# Patient Record
Sex: Female | Born: 1965 | Race: White | Hispanic: No | Marital: Single | Smoking: Current every day smoker
Health system: Southern US, Community
[De-identification: ages and names within clinical notes are randomized; demographics above are authoritative.]

## PROBLEM LIST (undated history)

## (undated) DIAGNOSIS — K219 Gastro-esophageal reflux disease without esophagitis: Secondary | ICD-10-CM

## (undated) DIAGNOSIS — G629 Polyneuropathy, unspecified: Secondary | ICD-10-CM

## (undated) DIAGNOSIS — F988 Other specified behavioral and emotional disorders with onset usually occurring in childhood and adolescence: Secondary | ICD-10-CM

## (undated) DIAGNOSIS — M797 Fibromyalgia: Secondary | ICD-10-CM

## (undated) DIAGNOSIS — F329 Major depressive disorder, single episode, unspecified: Secondary | ICD-10-CM

## (undated) DIAGNOSIS — F209 Schizophrenia, unspecified: Secondary | ICD-10-CM

## (undated) DIAGNOSIS — M419 Scoliosis, unspecified: Secondary | ICD-10-CM

## (undated) DIAGNOSIS — F419 Anxiety disorder, unspecified: Secondary | ICD-10-CM

## (undated) DIAGNOSIS — F32A Depression, unspecified: Secondary | ICD-10-CM

## (undated) HISTORY — PX: JOINT REPLACEMENT: SHX530

## (undated) HISTORY — PX: REPLACEMENT TOTAL KNEE BILATERAL: SUR1225

## (undated) HISTORY — PX: BILATERAL CARPAL TUNNEL RELEASE: SHX6508

## (undated) HISTORY — PX: FEMUR SURGERY: SHX943

## (undated) HISTORY — PX: CHOLECYSTECTOMY: SHX55

---

## 2018-02-20 ENCOUNTER — Emergency Department
Admission: EM | Admit: 2018-02-20 | Discharge: 2018-02-20 | Discharge status: Against Medical Advice | Payer: Self-pay | Attending: Emergency Medicine | Admitting: Emergency Medicine

## 2018-02-20 ENCOUNTER — Other Ambulatory Visit: Payer: Self-pay

## 2018-02-20 ENCOUNTER — Encounter: Payer: Self-pay | Admitting: Emergency Medicine

## 2018-02-20 DIAGNOSIS — F17210 Nicotine dependence, cigarettes, uncomplicated: Secondary | ICD-10-CM | POA: Insufficient documentation

## 2018-02-20 DIAGNOSIS — F17200 Nicotine dependence, unspecified, uncomplicated: Secondary | ICD-10-CM | POA: Insufficient documentation

## 2018-02-20 DIAGNOSIS — Z5900 Homelessness unspecified: Secondary | ICD-10-CM

## 2018-02-20 DIAGNOSIS — F1910 Other psychoactive substance abuse, uncomplicated: Secondary | ICD-10-CM | POA: Insufficient documentation

## 2018-02-20 DIAGNOSIS — F329 Major depressive disorder, single episode, unspecified: Secondary | ICD-10-CM | POA: Insufficient documentation

## 2018-02-20 DIAGNOSIS — Z96653 Presence of artificial knee joint, bilateral: Secondary | ICD-10-CM | POA: Insufficient documentation

## 2018-02-20 HISTORY — DX: Scoliosis, unspecified: M41.9

## 2018-02-20 HISTORY — DX: Other specified behavioral and emotional disorders with onset usually occurring in childhood and adolescence: F98.8

## 2018-02-20 HISTORY — DX: Polyneuropathy, unspecified: G62.9

## 2018-02-20 HISTORY — DX: Major depressive disorder, single episode, unspecified: F32.9

## 2018-02-20 HISTORY — DX: Schizophrenia, unspecified: F20.9

## 2018-02-20 HISTORY — DX: Fibromyalgia: M79.7

## 2018-02-20 HISTORY — DX: Anxiety disorder, unspecified: F41.9

## 2018-02-20 HISTORY — DX: Depression, unspecified: F32.A

## 2018-02-20 HISTORY — DX: Gastro-esophageal reflux disease without esophagitis: K21.9

## 2018-02-20 LAB — URINE DRUG SCREEN, QUALITATIVE (ARMC ONLY)
Amphetamines, Ur Screen: POSITIVE — AB
Barbiturates, Ur Screen: NOT DETECTED
Benzodiazepine, Ur Scrn: POSITIVE — AB
Cannabinoid 50 Ng, Ur ~~LOC~~: POSITIVE — AB
Cocaine Metabolite,Ur ~~LOC~~: POSITIVE — AB
MDMA (Ecstasy)Ur Screen: NOT DETECTED
Methadone Scn, Ur: NOT DETECTED
Opiate, Ur Screen: NOT DETECTED
Phencyclidine (PCP) Ur S: NOT DETECTED
Tricyclic, Ur Screen: NOT DETECTED

## 2018-02-20 LAB — COMPREHENSIVE METABOLIC PANEL
ALT: 20 U/L (ref 14–54)
AST: 25 U/L (ref 15–41)
Albumin: 3.9 g/dL (ref 3.5–5.0)
Alkaline Phosphatase: 123 U/L (ref 38–126)
Anion gap: 8 (ref 5–15)
BUN: 14 mg/dL (ref 6–20)
CHLORIDE: 106 mmol/L (ref 101–111)
CO2: 24 mmol/L (ref 22–32)
CREATININE: 0.72 mg/dL (ref 0.44–1.00)
Calcium: 8.9 mg/dL (ref 8.9–10.3)
GFR calc Af Amer: >60 mL/min (ref >60)
GFR calc non Af Amer: >60 mL/min (ref >60)
Glucose, Bld: 105 mg/dL — ABNORMAL HIGH (ref 65–99)
POTASSIUM: 4 mmol/L (ref 3.5–5.1)
SODIUM: 138 mmol/L (ref 135–145)
Total Bilirubin: 0.3 mg/dL (ref 0.3–1.2)
Total Protein: 7.7 g/dL (ref 6.5–8.1)

## 2018-02-20 LAB — CBC
HCT: 39.8 % (ref 35.0–47.0)
HEMOGLOBIN: 13.7 g/dL (ref 12.0–16.0)
MCH: 31.3 pg (ref 26.0–34.0)
MCHC: 34.4 g/dL (ref 32.0–36.0)
MCV: 90.9 fL (ref 80.0–100.0)
Platelets: 272 10*3/uL (ref 150–440)
RBC: 4.38 MIL/uL (ref 3.80–5.20)
RDW: 13.6 % (ref 11.5–14.5)
WBC: 8.5 10*3/uL (ref 3.6–11.0)

## 2018-02-20 LAB — SALICYLATE LEVEL

## 2018-02-20 LAB — ETHANOL: Alcohol, Ethyl (B): <10 mg/dL

## 2018-02-20 LAB — ACETAMINOPHEN LEVEL: Acetaminophen (Tylenol), Serum: <10 ug/mL — ABNORMAL LOW (ref 10–30)

## 2018-02-20 NOTE — ED Notes (Signed)
Dr. Paduchowski at bedside.  

## 2018-02-20 NOTE — ED Notes (Signed)
Patient screaming in room at MD [facility]. Patient stated: "I just want to die. I'm sick of this shit. You aren't doing anything to help me. I'm going to leave." This RN called security to bedside.  Patient informed MD she wanted to leave AMA. MD informed the she was within her rights to leave. Patient grabbed her luggage and left room. Patient would not let this RN take vital signs, nor would she sign AMA form.

## 2018-02-20 NOTE — ED Notes (Signed)
RN to bedside to greet patient. Patient tearful, asking what plan is. RN informed she would speak with MD. MD Paduchowski informed. MD reported he would update the patient.
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
## 2018-02-20 NOTE — ED Notes (Signed)
ED Provider at bedside. 

## 2018-02-21 ENCOUNTER — Other Ambulatory Visit: Payer: Self-pay

## 2018-02-21 ENCOUNTER — Encounter: Payer: Self-pay | Admitting: Emergency Medicine

## 2018-02-21 ENCOUNTER — Emergency Department
Admission: EM | Admit: 2018-02-21 | Discharge: 2018-02-21 | Disposition: A | Discharge status: Home / Self Care | Payer: Self-pay | Attending: Emergency Medicine | Admitting: Emergency Medicine

## 2018-02-21 DIAGNOSIS — F331 Major depressive disorder, recurrent, moderate: Secondary | ICD-10-CM

## 2018-02-21 DIAGNOSIS — F1410 Cocaine abuse, uncomplicated: Secondary | ICD-10-CM

## 2018-02-21 DIAGNOSIS — Z5900 Homelessness unspecified: Secondary | ICD-10-CM

## 2018-02-21 DIAGNOSIS — F1210 Cannabis abuse, uncomplicated: Secondary | ICD-10-CM

## 2018-02-21 DIAGNOSIS — F4325 Adjustment disorder with mixed disturbance of emotions and conduct: Secondary | ICD-10-CM

## 2018-02-21 DIAGNOSIS — F1910 Other psychoactive substance abuse, uncomplicated: Secondary | ICD-10-CM

## 2018-02-21 LAB — URINE DRUG SCREEN, QUALITATIVE (ARMC ONLY)
Amphetamines, Ur Screen: POSITIVE — AB
BARBITURATES, UR SCREEN: NOT DETECTED
Benzodiazepine, Ur Scrn: POSITIVE — AB
CANNABINOID 50 NG, UR ~~LOC~~: POSITIVE — AB
COCAINE METABOLITE, UR ~~LOC~~: POSITIVE — AB
MDMA (Ecstasy)Ur Screen: NOT DETECTED
Methadone Scn, Ur: NOT DETECTED
Opiate, Ur Screen: NOT DETECTED
PHENCYCLIDINE (PCP) UR S: NOT DETECTED
TRICYCLIC, UR SCREEN: NOT DETECTED

## 2018-02-21 LAB — CBC
HCT: 39.8 % (ref 35.0–47.0)
Hemoglobin: 13.5 g/dL (ref 12.0–16.0)
MCH: 31.5 pg (ref 26.0–34.0)
MCHC: 34 g/dL (ref 32.0–36.0)
MCV: 92.7 fL (ref 80.0–100.0)
PLATELETS: 247 10*3/uL (ref 150–440)
RBC: 4.29 MIL/uL (ref 3.80–5.20)
RDW: 13.8 % (ref 11.5–14.5)
WBC: 6.3 10*3/uL (ref 3.6–11.0)

## 2018-02-21 LAB — BASIC METABOLIC PANEL
Anion gap: 6 (ref 5–15)
BUN: 18 mg/dL (ref 6–20)
CO2: 28 mmol/L (ref 22–32)
CREATININE: 0.8 mg/dL (ref 0.44–1.00)
Calcium: 8.9 mg/dL (ref 8.9–10.3)
Chloride: 103 mmol/L (ref 101–111)
GFR calc Af Amer: >60 mL/min (ref >60)
GLUCOSE: 118 mg/dL — AB (ref 65–99)
Potassium: 4.1 mmol/L (ref 3.5–5.1)
SODIUM: 137 mmol/L (ref 135–145)

## 2018-02-21 MED ORDER — GABAPENTIN 300 MG PO CAPS
300.0000 mg | ORAL_CAPSULE | Freq: Three times a day (TID) | ORAL | Status: DC
  Filled 2018-02-21 (×2): qty 1

## 2018-02-21 MED ORDER — ACETAMINOPHEN 500 MG PO TABS
1000.0000 mg | ORAL_TABLET | Freq: Once | ORAL | Status: AC
  Administered 2018-02-21: 1000 mg via ORAL
  Filled 2018-02-21: qty 2

## 2018-02-21 MED ORDER — QUETIAPINE FUMARATE 25 MG PO TABS: 100.0000 mg | ORAL_TABLET | Freq: Every day | ORAL | Status: DC

## 2018-02-21 MED ORDER — IBUPROFEN 600 MG PO TABS
600.0000 mg | ORAL_TABLET | ORAL | Status: AC
  Administered 2018-02-21: 600 mg via ORAL
  Filled 2018-02-21: qty 1

## 2018-02-21 NOTE — ED Notes (Signed)
Patient to 20-A; refusing to talk; stating, "Can't they read; I told them earlier what my meds are; I'm not going to repeat it again."

## 2018-02-21 NOTE — ED Notes (Signed)
Patient alert and oriented. Patient denies SI/HI and A/V hallucinations. Patient tearful and states she just want to go home. Patient sad states she came here from [place] and has no where or no one. Patient provided support and encouragement. Monitoring of patient continues.

## 2018-02-21 NOTE — ED Notes (Signed)
Patient speaking with S.O.C.
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
## 2018-02-21 NOTE — BH Assessment (Signed)
Per Dr. Prescott Gum patient doesn't meet criteria for inpatient psychiatric treatment.

## 2018-02-21 NOTE — ED Notes (Signed)
Patient discharge to homeless shelter. Patient provided transport via cab to shelter. Patient alert, oriented and ambulatory. Patient denies pain. All patient belongings returned to her. AVS/Discharge instructions reviewed with patient. Patient given written copy and verbalized understanding. Patient vitals at discharge 98.1-106/81-69-20-99% room air.
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
## 2018-02-21 NOTE — BH Assessment (Signed)
Pt denied to speak with TTS. TTS asked pt how she was doing pt responded, "How do I look?" Pt then proceeded to say can I have some privacy?!" TTS offered to escort pt to consult room for privacy during assessment, pt responded by saying"No, I don't want to walk. I didn't come here to walk."

## 2018-02-21 NOTE — ED Notes (Signed)
Patient offered breakfast meal and she decline.
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
## 2018-02-21 NOTE — Discharge Instructions (Addendum)

## 2018-02-21 NOTE — ED Notes (Signed)
Lunch meal given to patient.

## 2018-02-21 NOTE — ED Provider Notes (Addendum)
Patient has been resting comfortably in no distress throughout the shift.  She has eaten a meal tray.  Seen by Dr. Weber Cooks who advises:  Disposition: No evidence of imminent risk to self or others at present.   Patient does not meet criteria for psychiatric inpatient admission. Supportive therapy provided about ongoing stressors. Discussed crisis plan, support from social network, calling 911, coming to the Emergency Department, and calling Suicide Hotline.    ----------------------------------------- 1:06 PM on 02/21/2018 -----------------------------------------  Currently awaiting social work consult due to the patient's tenuous living situation and homelessness.  IVC rescinded by Dr. Weber Cooks.   Delman Kitten, MD 02/21/18 1306   ----------------------------------------- 1:46 PM on 02/21/2018 -----------------------------------------  Patient resting comfortably in no distress.  Seen by social work, patient being advised to return to the local shelter, she may not be able to get in but unfortunately does not have any immediate placement option.  She is unfortunately homeless, resources offered patient will be discharged.  Cleared by psychiatry.  She is alert, well oriented no acute distress.  Return precautions and treatment recommendations and follow-up discussed with the patient who is agreeable with the plan.     Delman Kitten, MD 02/21/18 [phone]

## 2018-02-21 NOTE — ED Notes (Signed)
Pt has in her belongings:   2 beach bags full of clothes/ 1 suitcase full of clothes/  1 duffle bag full of clothes/ 2 bras on her person/ 1 tank top on her person/ 1 pair of black pants/ 1 underwear/ 1 pair of grey socks/ 1 pair of grey & orange shoes/ 1 red hair band/ 2 pink hair scrunchies/ 1 set of grey colored dangle hoop earrings/ 1 pair of clear stoned earrings/ 1 yellow ring with a clear stone in the middle and 1 yellow ring with a large grey stone in the middle/ & 1 brown purse. In said purse contains the patients makeup and additional jewlery which she states is not worth the trouble of documenting as its "cheap and invaluable" this EDT tried to persuade pt to please allow me to document it and as requested she pulled out the two containers which has various stud and hoop earrings. Pt requested that it all stay in her purse where it was initially found.

## 2018-02-21 NOTE — BH Assessment (Signed)
Assessment Note  Monique Schaefer is a 52 y.o. female pt who returned to ED after being discharged earlier on the previous evening due to depression and recently becoming homeless. Pt abruptly left ED on previous encounter after becoming angry towards the doctor. Pt stayed in waiting room until she presented for this encounter. Pt reported to writer that she does not have a plan to kill herself, but "would be okay with not waking up. Pt agitated and verbally aggressive prior to assessment, but calmer during assessment. Pt reports she recently moved to [place] from [facility] on this previous Friday and has since been kicked out of her friends' home in which she moved to. Pt endorses AH stating, "I hear chatter sometimes." Pt endorses no active SI, HI, or VH.  Diagnosis: Depression  Past Medical History:  Past Medical History:  Diagnosis Date  . ADD (attention deficit disorder)   . Anxiety   . Depression   . Fibromyalgia   . GERD (gastroesophageal reflux disease)   . Neuropathy   . Schizophrenia ([place])   . Scoliosis     Past Surgical History:  Procedure Laterality Date  . BILATERAL CARPAL TUNNEL RELEASE Bilateral   . CHOLECYSTECTOMY    . FEMUR SURGERY Left   . JOINT REPLACEMENT    . REPLACEMENT TOTAL KNEE BILATERAL Bilateral     Family History: History reviewed. No pertinent family history.  Social History:  reports that she has been smoking.  She has never used smokeless tobacco. She reports that she drinks alcohol. She reports that she has current or past drug history. Drug: Marijuana.  Additional Social History:  Alcohol / Drug Use Pain Medications: see mar Prescriptions: see mar Over the Counter: see mar  History of alcohol / drug use?: Yes Longest period of sobriety (when/how long): unknown Negative Consequences of Use: Financial, Personal relationships Substance #1 Name of Substance 1: marijuana 1 - Age of First Use: pt unable to recall 1 - Amount (size/oz): varies 1 - Frequency:  weekly 1 - Duration: unknown 1 - Last Use / Amount: yesterday Substance #2 Name of Substance 2: cocaine 2 - Age of First Use: pt unable to recall 2 - Amount (size/oz): varies 2 - Frequency: weekly 2 - Duration: unknown 2 - Last Use / Amount: yesterday  CIWA: CIWA-Ar BP: (!) 143/99 Pulse Rate: 88 COWS:    Allergies:  Allergies  Allergen Reactions  . Paxil [Paroxetine Hcl] Swelling  . Morphine And Related Itching    Home Medications:  (Not in a hospital admission)  OB/GYN Status:  No LMP recorded. Patient is postmenopausal.  General Assessment Data Location of Assessment: [facility] ED TTS Assessment: In system Is this a Tele or Face-to-Face Assessment?: Face-to-Face Is this an Initial Assessment or a Re-assessment for this encounter?: Initial Assessment Marital status: Single Is patient pregnant?: No Pregnancy Status: No Living Arrangements: Other (Comment)(homeless) Can pt return to current living arrangement?: No Admission Status: Voluntary Is patient capable of signing voluntary admission?: Yes Referral Source: Self/Family/Friend Insurance type: None     Crisis Care Plan Living Arrangements: Other (Comment)(homeless) Legal Guardian: Other:(self) Name of Psychiatrist: Name unknown: [facility] Name of Therapist: Name unkown: [facility] [place]  Education Status Is patient currently in school?: No Is the patient employed, unemployed or receiving disability?: Unemployed  Risk to self with the past 6 months Suicidal Ideation: No-Not Currently/Within Last 6 Months Has patient been a risk to self within the past 6 months prior to admission? : No Suicidal Intent:  No Has patient had any suicidal intent within the past 6 months prior to admission? : No Is patient at risk for suicide?: No, but patient needs Medical Clearance Suicidal Plan?: No Has patient had any suicidal plan within the past 6 months prior to admission? : No Access to Means: No(None) What has been  your use of drugs/alcohol within the last 12 months?: Pt reports to using marijunana and cocaine recently Previous Attempts/Gestures: Yes How many times?: 2 Other Self Harm Risks: None indicated Triggers for Past Attempts: Family contact, Hallucinations Intentional Self Injurious Behavior: None Family Suicide History: No Recent stressful life event(s): Financial Problems, Recent negative physical changes, Turmoil (Comment) Persecutory voices/beliefs?: No Depression: Yes Depression Symptoms: Despondent, Tearfulness, Isolating, Fatigue, Feeling angry/irritable Substance abuse history and/or treatment for substance abuse?: Yes Suicide prevention information given to non-admitted patients: Yes  Risk to Others within the past 6 months Homicidal Ideation: No Does patient have any lifetime risk of violence toward others beyond the six months prior to admission? : No Thoughts of Harm to Others: No Current Homicidal Intent: No Current Homicidal Plan: No Access to Homicidal Means: No Identified Victim: None History of harm to others?: No Assessment of Violence: None Noted Violent Behavior Description: None indicated Does patient have access to weapons?: No Criminal Charges Pending?: No Does patient have a court date: No Is patient on probation?: No  Psychosis Hallucinations: Auditory Delusions: None noted  Mental Status Report Appearance/Hygiene: Disheveled, In scrubs Eye Contact: Fair Motor Activity: Agitation, Shuffling Speech: Pressured, Soft Level of Consciousness: Irritable Mood: Depressed, Anxious, Helpless Affect: Anxious, Depressed, Irritable Anxiety Level: Moderate Thought Processes: Relevant Judgement: Partial Orientation: Person, Place, Time, Situation, Appropriate for developmental age Obsessive Compulsive Thoughts/Behaviors: None  Cognitive Functioning Concentration: Decreased Memory: Remote Intact, Recent Impaired Is patient IDD: No Is patient DD?: No Insight:  Fair Impulse Control: Fair Appetite: Good Have you had any weight changes? : No Change Sleep: Decreased Total Hours of Sleep: 4 Vegetative Symptoms: None  ADLScreening [facility]) Patient's cognitive ability adequate to safely complete daily activities?: Yes Patient able to express need for assistance with ADLs?: Yes Independently performs ADLs?: Yes (appropriate for developmental age)  Prior Inpatient Therapy Prior Inpatient Therapy: No(Pt denies)  Prior Outpatient Therapy Prior Outpatient Therapy: Yes Prior Therapy Dates: 2012-2019 Prior Therapy Facilty/Provider(s): agency in [place], pt could not recall Reason for Treatment: depression, bipolar Does patient have an ACCT team?: No Does patient have Intensive In-House Services?  : No Does patient have [facility] services? : No Does patient have P4CC services?: No  ADL Screening (condition at time of admission) Patient's cognitive ability adequate to safely complete daily activities?: Yes Is the patient deaf or have difficulty hearing?: No Does the patient have difficulty seeing, even when wearing glasses/contacts?: No Does the patient have difficulty concentrating, remembering, or making decisions?: No Patient able to express need for assistance with ADLs?: Yes Does the patient have difficulty dressing or bathing?: No Independently performs ADLs?: Yes (appropriate for developmental age) Does the patient have difficulty walking or climbing stairs?: No Weakness of Legs: None Weakness of Arms/Hands: None  Home Assistive Devices/Equipment Home Assistive Devices/Equipment: None  Therapy Consults (therapy consults require a physician order) PT Evaluation Needed: No OT Evalulation Needed: No SLP Evaluation Needed: No Abuse/Neglect Assessment (Assessment to be complete while patient is alone) Abuse/Neglect Assessment Can Be Completed: Yes Physical Abuse: Denies Verbal Abuse: Denies Sexual Abuse: Denies Exploitation of  patient/patient's resources: Denies Self-Neglect: Denies Values / Beliefs Cultural Requests During Hospitalization:  None Spiritual Requests During Hospitalization: None Consults Spiritual Care Consult Needed: No Social Work Consult Needed: No      Additional Information 1:1 In Past 12 Months?: No CIRT Risk: No Elopement Risk: No Does patient have medical clearance?: Yes     Disposition:  Disposition Initial Assessment Completed for this Encounter: Yes Disposition of Patient: (Pending SOC) Patient refused recommended treatment: No Mode of transportation if patient is discharged?: Walking Patient referred to: (Pending SOC)  On Site Evaluation by:   Reviewed with Physician:    Ethelene Browns, [place], [facility] 02/21/2018 4:50 AM

## 2018-02-21 NOTE — ED Triage Notes (Signed)
Pt was brought to the ED by police earlier on Saturday evening as she was homeless and feeling depressed; pt moved here from [place] Friday and was kicked out of the hotel by her friend with whom she was staying; pt says friend took her ID and medication; pt was in a treatment room in this ED Saturday evening and got angry with the doctor; took her belongings and left; pt has been in the [facility] since she walked out of the treatment room, with nowhere to go (shelter would not take her as she has no ID); pt has checked back in feeling depressed; says she doesn't want to live anymore but doesn't think she'd "have the guts to actually do it"; pt tearful; c/o back and leg pain;

## 2018-02-21 NOTE — ED Notes (Signed)
Pt said "I'm not going to kill myself. I don't want to kill myself." then adds she would however like to fall asleep and not wake up

## 2018-02-21 NOTE — ED Notes (Signed)
Meal tray given to pt.

## 2018-02-21 NOTE — ED Notes (Signed)
Urine and blood collected from patient.

## 2018-03-02 ENCOUNTER — Emergency Department
Admission: EM | Admit: 2018-03-02 | Discharge: 2018-03-02 | Disposition: A | Discharge status: Home / Self Care | Payer: Self-pay | Attending: Emergency Medicine | Admitting: Emergency Medicine

## 2018-03-02 ENCOUNTER — Encounter: Payer: Self-pay | Admitting: Emergency Medicine

## 2018-03-02 DIAGNOSIS — F17210 Nicotine dependence, cigarettes, uncomplicated: Secondary | ICD-10-CM | POA: Insufficient documentation

## 2018-03-02 DIAGNOSIS — G8929 Other chronic pain: Secondary | ICD-10-CM | POA: Insufficient documentation

## 2018-03-02 DIAGNOSIS — M545 Low back pain: Secondary | ICD-10-CM | POA: Insufficient documentation

## 2018-03-02 DIAGNOSIS — R300 Dysuria: Secondary | ICD-10-CM | POA: Insufficient documentation

## 2018-03-02 LAB — URINALYSIS, COMPLETE (UACMP) WITH MICROSCOPIC
BACTERIA UA: NONE SEEN
Bilirubin Urine: NEGATIVE
Glucose, UA: NEGATIVE mg/dL
HGB URINE DIPSTICK: NEGATIVE
Ketones, ur: NEGATIVE mg/dL
Leukocytes, UA: NEGATIVE
Nitrite: NEGATIVE
PROTEIN: NEGATIVE mg/dL
Specific Gravity, Urine: 1.013 (ref 1.005–1.030)
pH: 5 (ref 5.0–8.0)

## 2018-03-02 MED ORDER — PHENAZOPYRIDINE HCL 100 MG PO TABS
95.0000 mg | ORAL_TABLET | Freq: Once | ORAL | Status: AC
  Administered 2018-03-02: 100 mg via ORAL
  Filled 2018-03-02: qty 1

## 2018-03-02 MED ORDER — BACLOFEN 10 MG PO TABS: 10.0000 mg | ORAL_TABLET | Freq: Three times a day (TID) | ORAL | 0 refills | Status: DC

## 2018-03-02 MED ORDER — PHENAZOPYRIDINE HCL 200 MG PO TABS: 200.0000 mg | ORAL_TABLET | Freq: Three times a day (TID) | ORAL | 0 refills | Status: DC | PRN

## 2018-03-02 MED ORDER — BACLOFEN 10 MG PO TABS
20.0000 mg | ORAL_TABLET | Freq: Three times a day (TID) | ORAL | Status: DC
  Administered 2018-03-02: 20 mg via ORAL
  Filled 2018-03-02 (×3): qty 2

## 2018-03-02 NOTE — ED Triage Notes (Signed)
Pt reports has chronic back pain but here lately the pain has been higher and she is having urinary urgency, frequency and dysuria. Pt denies other sx's. Thinks she has a UTI.

## 2018-03-02 NOTE — ED Notes (Signed)
See triage note  Presents with 4 day hx of dysuria and back pain

## 2018-03-02 NOTE — ED Provider Notes (Signed)
[facility] Emergency Department Provider Note  ____________________________________________  Time seen: Approximately 10:19 AM  I have reviewed the triage vital signs and the nursing notes.   HISTORY  Chief Complaint Back Pain; Urinary Frequency; and Dysuria    HPI Monique Schaefer is a 52 y.o. female who presents to the emergency department for treatment and evaluation of back pain with urinary urgency, frequency, and dysuria. No known fever. She has chronic back pain that has been worse for the past 4-5 days. Pain is across lumbar and flank bilaterally and radiates into the groin. She reports history of kidney stones and this is similar. She has taken ibuprofen and 2 Tramadol without relief and is now out of them. She states that she has only been here "2 weeks" and has not established a PCP, but does have information for the [facility].  Past Medical History:  Diagnosis Date  . ADD (attention deficit disorder)   . Anxiety   . Depression   . Fibromyalgia   . GERD (gastroesophageal reflux disease)   . Neuropathy   . Schizophrenia ([place])   . Scoliosis     Patient Active Problem List   Diagnosis Date Noted  . 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 Cocaine abuse ([place]) 02/21/2018  . Cannabis abuse 02/21/2018  . Homelessness 02/21/2018    Past Surgical History:  Procedure Laterality Date  . BILATERAL CARPAL TUNNEL RELEASE Bilateral   . CHOLECYSTECTOMY    . FEMUR SURGERY Left   . JOINT REPLACEMENT    . REPLACEMENT TOTAL KNEE BILATERAL Bilateral     Prior to Admission medications   Medication Sig Start Date End Date Taking? Authorizing Provider  baclofen (LIORESAL) 10 MG tablet Take 1 tablet (10 mg total) by mouth 3 (three) times daily. 03/02/18   Ila Landowski, Johnette Abraham B, FNP  phenazopyridine (PYRIDIUM) 200 MG tablet Take 1 tablet (200 mg total) by mouth 3 (three) times daily as needed for pain. 03/02/18    Chandi Nicklin, Johnette Abraham B, FNP    Allergies Paxil [paroxetine hcl] and Morphine and related  No family history on file.  Social History Social History   Tobacco Use  . Smoking status: Current Every Day Smoker  . Smokeless tobacco: Never Used  Substance Use Topics  . Alcohol use: Yes  . Drug use: Yes    Types: Marijuana    Review of Systems Constitutional: Negative for fever. Respiratory: Negative for shortness of breath or cough. Gastrointestinal: Positive for suprapubic pressure; negative for nausea , negative for vomiting. Negative for diarrhea or constipation. Genitourinary: Positive for dysuria , negative for vaginal discharge. Negative for STD exposure. Musculoskeletal: Chronic back pain. Skin: Intact. ____________________________________________   PHYSICAL EXAM:  VITAL SIGNS: ED Triage Vitals  Enc Vitals Group     BP 03/02/18 0950 130/88     Pulse Rate 03/02/18 0950 72     Resp 03/02/18 0950 20     Temp 03/02/18 0950 98.1 F (36.7 C)     Temp Source 03/02/18 0950 Oral     SpO2 03/02/18 0950 100 %     Weight 03/02/18 0950 210 lb (95.3 kg)     Height 03/02/18 0950 5\' 7"  (1.702 m)     Head Circumference --      Peak Flow --      Pain Score 03/02/18 1001 6     Pain Loc --      Pain Edu? --      Excl.  in [place]? --     Constitutional: Alert and oriented. Well appearing and in no acute distress. Eyes: Conjunctivae are normal. PERRL. EOMI. Head: Atraumatic. Nose: No congestion/rhinnorhea. Mouth/Throat: Mucous membranes are moist. Respiratory: Normal respiratory effort.  No retractions. Gastrointestinal:  Genitourinary: Pelvic exam: not indicated. Musculoskeletal: No extremity tenderness nor edema.  Neurologic:  Normal speech and language. No gross focal neurologic deficits are appreciated. Speech is normal. No gait instability. Skin:  Skin is warm, dry and intact. No rash noted. Psychiatric: Mood and affect are normal. Speech and behavior are  normal.  ____________________________________________   LABS (all labs ordered are listed, but only abnormal results are displayed)  Labs Reviewed  URINALYSIS, COMPLETE (UACMP) WITH MICROSCOPIC - Abnormal; Notable for the following components:      Result Value   Color, Urine YELLOW (*)    APPearance CLEAR (*)    All other components within normal limits  URINE CULTURE   ____________________________________________  RADIOLOGY  Not indicated. ____________________________________________   PROCEDURES  Procedure(s) performed: None   ____________________________________________  Labs from previous 2 visits reviewed.  52 year old female presenting to the ER for dysuria and chronic back pain. She reports symptoms are similar to previous kidney stones, but her urine is negative for hgb or RBC and pain is bilateral lumbar/flank area. She is able to relax on the bed. She will be treated symptomatically with pyridium for her dysuria and a urine culture was requested and baclofen will be prescribed for her back pain. She is to follow up with [facility] as previously advised at discharge from her visit here on 02/21/18.   INITIAL IMPRESSION / ASSESSMENT AND PLAN / ED COURSE  Pertinent labs & imaging results that were available during my care of the patient were reviewed by me and considered in my medical decision making (see chart for details).  ____________________________________________   FINAL CLINICAL IMPRESSION(S) / ED DIAGNOSES  Final diagnoses:  Dysuria  Chronic bilateral low back pain without sciatica    Note:  This document was prepared using Dragon voice recognition software and may include unintentional dictation errors.    Victorino Dike, FNP 03/02/18 1115    Lavonia Drafts, MD 03/02/18 1134

## 2018-03-03 LAB — URINE CULTURE: CULTURE: NO GROWTH

## 2018-03-04 ENCOUNTER — Ambulatory Visit: Payer: Self-pay | Admitting: Licensed Clinical Social Worker

## 2018-03-04 ENCOUNTER — Ambulatory Visit: Payer: Self-pay | Admitting: Adult Health Nurse Practitioner

## 2018-03-04 DIAGNOSIS — F4325 Adjustment disorder with mixed disturbance of emotions and conduct: Secondary | ICD-10-CM

## 2018-03-04 DIAGNOSIS — M5442 Lumbago with sciatica, left side: Principal | ICD-10-CM

## 2018-03-04 DIAGNOSIS — G8929 Other chronic pain: Secondary | ICD-10-CM

## 2018-03-04 DIAGNOSIS — M5441 Lumbago with sciatica, right side: Principal | ICD-10-CM

## 2018-03-04 DIAGNOSIS — Z0000 Encounter for general adult medical examination without abnormal findings: Secondary | ICD-10-CM | POA: Insufficient documentation

## 2018-03-04 DIAGNOSIS — M549 Dorsalgia, unspecified: Secondary | ICD-10-CM | POA: Insufficient documentation

## 2018-03-04 MED ORDER — GABAPENTIN 300 MG PO CAPS: 300.0000 mg | ORAL_CAPSULE | Freq: Three times a day (TID) | ORAL | 1 refills | Status: DC

## 2018-03-04 NOTE — Progress Notes (Signed)
Clinician met with the patient in clinic after feedback from [place] during triage that the patient needed to speak with the social worker. She administered the PHQ 9 and discussed life circumstances with the patient. Her score was a 16.  Ms. Hudock explained that she was diagnosed with Bipolar disorder, ADHD, and a has history of insomnia in the last few years. She notes that she moved her to stay with a friend from [place] but her friend is on drugs and she doesn't want to be around her. She reports that she slept on the streets for three nights until she was able to find a place to go.  She notes that she is currently staying at [employer] [employer] [facility]. She explains that she has been applying for jobs and had an interview. She reports that she has been suffering from chronic back pain. She notes that she has scoliosis in her back. She reports that she had a her gallbladder removed and two knee replacements.   Ms. Klemz explained that she wants to be able to stay on her mental health medications because they help with her symptoms. She is prescribed Seroquel 100 mg at bedtime, Adderall 20 mg twice daily, and Klonopin 1 mg twice daily for insomnia.   Clinician provided the client with a business card to see her in clinic and a handout for [place].

## 2018-03-04 NOTE — Progress Notes (Signed)
  Patient: Monique Schaefer Female    DOB: [birth year]   52 y.o.   MRN: [id number] Visit Date: 03/04/2018  Today's Provider: Staci Acosta, NP   Chief Complaint  Patient presents with  . Back Pain    new pt eval   Subjective:    HPI   Recently seen in the ED for back pain and dysuria- UA negative.  Treated with Pyridium.   Was given baclofen for the back pain.  Has hx of scoliosis.  Patient states that the pain radiates down her legs- she does endorse that gabapentin help the pain.  She is from [place]- she is looking for a job; currently at the homeless shelter.        Allergies  Allergen Reactions  . Paxil [Paroxetine Hcl] Swelling  . Morphine And Related Itching  . Oxycodone-Acetaminophen Itching   Previous Medications   AMPHETAMINE-DEXTROAMPHETAMINE (ADDERALL) 20 MG TABLET       BACLOFEN (LIORESAL) 10 MG TABLET    Take 1 tablet (10 mg total) by mouth 3 (three) times daily.   CLONAZEPAM (KLONOPIN) 1 MG TABLET    Take by mouth.   GABAPENTIN (NEURONTIN) 300 MG CAPSULE    Take by mouth.   IBUPROFEN (ADVIL,MOTRIN) 200 MG TABLET    Take by mouth.   RANITIDINE (ZANTAC) 150 MG TABLET    Take by mouth.    Review of Systems  All other systems reviewed and are negative.   Social History   Tobacco Use  . Smoking status: Current Every Day Smoker  . Smokeless tobacco: Never Used  Substance Use Topics  . Alcohol use: Yes   Objective:   BP 124/89   Temp 98.1 F (36.7 C)   Ht 5' 7.5" (1.715 m)   Wt 206 lb 14.4 oz (93.8 kg)   BMI 31.93 kg/m   Physical Exam  Constitutional: She appears well-developed and well-nourished.  HENT:  Head: Normocephalic and atraumatic.  Eyes: Pupils are equal, round, and reactive to light. EOM are normal.  Neck: Normal range of motion.  Cardiovascular: Normal rate, regular rhythm and normal heart sounds.  Pulmonary/Chest: Effort normal and breath sounds normal.  Abdominal: Soft. Bowel sounds are normal.  Musculoskeletal:       Lumbar  back: She exhibits decreased range of motion, tenderness and pain.       Back:  Skin: Skin is warm and dry.  Psychiatric:  tearful        Assessment & Plan:         Referral to Dr. Vickki Hearing for back pain.  Gabapentin refill.   Labs from hospital reviewed.  Lipid, a1c and TSH ordered.   FU in 2 months.   FU with [facility] as directed.   Staci Acosta, NP   [facility] [place]

## 2018-03-10 ENCOUNTER — Other Ambulatory Visit: Payer: Self-pay

## 2018-03-17 ENCOUNTER — Institutional Professional Consult (permissible substitution): Payer: Self-pay | Admitting: Specialist

## 2018-03-24 ENCOUNTER — Other Ambulatory Visit: Payer: Self-pay

## 2018-03-25 LAB — LIPID PANEL
Chol/HDL Ratio: 2.1 ratio (ref 0.0–4.4)
Cholesterol, Total: 149 mg/dL (ref 100–199)
HDL: 71 mg/dL (ref >39)
LDL Calculated: 65 mg/dL (ref 0–99)
TRIGLYCERIDES: 67 mg/dL (ref 0–149)
VLDL CHOLESTEROL CAL: 13 mg/dL (ref 5–40)

## 2018-03-25 LAB — HEMOGLOBIN A1C
ESTIMATED AVERAGE GLUCOSE: 105 mg/dL
HEMOGLOBIN A1C: 5.3 % (ref 4.8–5.6)

## 2018-03-25 LAB — TSH: TSH: 5.1 u[IU]/mL — ABNORMAL HIGH (ref 0.450–4.500)

## 2018-03-31 ENCOUNTER — Ambulatory Visit: Payer: Self-pay | Admitting: Specialist

## 2018-03-31 ENCOUNTER — Telehealth: Payer: Self-pay | Admitting: Licensed Clinical Social Worker

## 2018-03-31 DIAGNOSIS — M545 Low back pain: Secondary | ICD-10-CM

## 2018-03-31 NOTE — Telephone Encounter (Signed)
Clinician reached out to the patient on her request of wanting to see the psychiatrist.  Clinician left a detailed message with contact information. She explained that she has seen her as a warm hand off in clinic but not for a full on evaluation and she will need to schedule an appointment to see her first. She explains that she then has to review her case with the psychiatrist and he will determine if he will see her or not.

## 2018-03-31 NOTE — Progress Notes (Signed)
   Subjective:    Patient ID: Monique Schaefer, female    DOB: Sep 19, 1966, 52 y.o.   MRN: [id number]  HPI Long standing hx of LBP. She has had a MRI in [place] and was told she had degeneration. She is a current everyday smoker and had the patches and is starting today. 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 She has done PT in the past. She also has periprosthetic fx on left. She has had bilateral TKAs. The left was in 2014 and the rt in 2016.    Review of Systems     Objective:   Physical Exam  She has a halting gait but with no antalgic component. She is able to stand on her toes but has increased rt knee and hip pain. She is able to walk on her heels. To light touch there is no increased pain. She is tender over the rt greater trochanter. She is able to march in place with normal muscle recruitment and relaxation. She is able to stand on each leg independently with a neg trendelenburg sign. Seated her SLR is 90 degrees. ROM 20 degrees of rt thoracolumbar flexion which causes rt lateral back pain. She has 25 degrees to the left of mild left sided back pain. She has full forward flexion and 10 degrees of extension.  On inspection of her knees she has well healed scars.  MMT foot and ankle 5/5. Knee flexion/extension 2/5. Hip 5/5.  DTR's 0 at the knees 1+ at the ankles. Toe signs are downward.  Assessment & Plan:  IMP: When I told pt we had very little to offer she became upset and asked why she was even here. She wishes a doctor to tell her how to deal with her pain and she can't " take it". She has told us several times she does not want to lose her job. She has applied for SSDI and has been denied twice and is now under appeal. She does want a doctors visit today for hives that she has been having for over a week now.  Plan: We asked her to obtain her MRI results from [place] back in Dec of 2018. RTC PRN.

## 2018-04-02 ENCOUNTER — Other Ambulatory Visit: Payer: Self-pay

## 2018-04-02 ENCOUNTER — Emergency Department
Admission: EM | Admit: 2018-04-02 | Discharge: 2018-04-02 | Disposition: A | Discharge status: Home / Self Care | Payer: Self-pay | Attending: Emergency Medicine | Admitting: Emergency Medicine

## 2018-04-02 ENCOUNTER — Encounter: Payer: Self-pay | Admitting: Emergency Medicine

## 2018-04-02 DIAGNOSIS — F419 Anxiety disorder, unspecified: Secondary | ICD-10-CM | POA: Insufficient documentation

## 2018-04-02 DIAGNOSIS — F1410 Cocaine abuse, uncomplicated: Secondary | ICD-10-CM | POA: Insufficient documentation

## 2018-04-02 DIAGNOSIS — R21 Rash and other nonspecific skin eruption: Secondary | ICD-10-CM | POA: Insufficient documentation

## 2018-04-02 DIAGNOSIS — S20369A Insect bite (nonvenomous) of unspecified front wall of thorax, initial encounter: Secondary | ICD-10-CM | POA: Insufficient documentation

## 2018-04-02 DIAGNOSIS — Z79899 Other long term (current) drug therapy: Secondary | ICD-10-CM | POA: Insufficient documentation

## 2018-04-02 DIAGNOSIS — Y998 Other external cause status: Secondary | ICD-10-CM | POA: Insufficient documentation

## 2018-04-02 DIAGNOSIS — F17200 Nicotine dependence, unspecified, uncomplicated: Secondary | ICD-10-CM | POA: Insufficient documentation

## 2018-04-02 DIAGNOSIS — Z96653 Presence of artificial knee joint, bilateral: Secondary | ICD-10-CM | POA: Insufficient documentation

## 2018-04-02 DIAGNOSIS — F209 Schizophrenia, unspecified: Secondary | ICD-10-CM | POA: Insufficient documentation

## 2018-04-02 DIAGNOSIS — S80862A Insect bite (nonvenomous), left lower leg, initial encounter: Secondary | ICD-10-CM | POA: Insufficient documentation

## 2018-04-02 DIAGNOSIS — Y9389 Activity, other specified: Secondary | ICD-10-CM | POA: Insufficient documentation

## 2018-04-02 DIAGNOSIS — F1210 Cannabis abuse, uncomplicated: Secondary | ICD-10-CM | POA: Insufficient documentation

## 2018-04-02 DIAGNOSIS — F909 Attention-deficit hyperactivity disorder, unspecified type: Secondary | ICD-10-CM | POA: Insufficient documentation

## 2018-04-02 DIAGNOSIS — Z590 Homelessness: Secondary | ICD-10-CM | POA: Insufficient documentation

## 2018-04-02 DIAGNOSIS — Y9289 Other specified places as the place of occurrence of the external cause: Secondary | ICD-10-CM | POA: Insufficient documentation

## 2018-04-02 DIAGNOSIS — F329 Major depressive disorder, single episode, unspecified: Secondary | ICD-10-CM | POA: Insufficient documentation

## 2018-04-02 DIAGNOSIS — W57XXXA Bitten or stung by nonvenomous insect and other nonvenomous arthropods, initial encounter: Secondary | ICD-10-CM | POA: Insufficient documentation

## 2018-04-02 DIAGNOSIS — Z9049 Acquired absence of other specified parts of digestive tract: Secondary | ICD-10-CM | POA: Insufficient documentation

## 2018-04-02 DIAGNOSIS — S80861A Insect bite (nonvenomous), right lower leg, initial encounter: Secondary | ICD-10-CM | POA: Insufficient documentation

## 2018-04-02 DIAGNOSIS — S20469A Insect bite (nonvenomous) of unspecified back wall of thorax, initial encounter: Secondary | ICD-10-CM | POA: Insufficient documentation

## 2018-04-02 MED ORDER — RANITIDINE HCL 150 MG PO TABS: 150.0000 mg | ORAL_TABLET | Freq: Two times a day (BID) | ORAL | 0 refills | Status: DC

## 2018-04-02 MED ORDER — DEXAMETHASONE SODIUM PHOSPHATE 10 MG/ML IJ SOLN
10.0000 mg | Freq: Once | INTRAMUSCULAR | Status: AC
  Administered 2018-04-02: 10 mg via INTRAMUSCULAR
  Filled 2018-04-02: qty 1

## 2018-04-02 MED ORDER — CYPROHEPTADINE HCL 4 MG PO TABS
4.0000 mg | ORAL_TABLET | Freq: Once | ORAL | Status: AC
  Administered 2018-04-02: 4 mg via ORAL
  Filled 2018-04-02: qty 1

## 2018-04-02 MED ORDER — CYPROHEPTADINE HCL 4 MG PO TABS: 4.0000 mg | ORAL_TABLET | Freq: Three times a day (TID) | ORAL | 0 refills | Status: DC | PRN

## 2018-04-02 MED ORDER — FAMOTIDINE 20 MG PO TABS
40.0000 mg | ORAL_TABLET | Freq: Once | ORAL | Status: AC
  Administered 2018-04-02: 40 mg via ORAL
  Filled 2018-04-02: qty 2

## 2018-04-02 NOTE — ED Triage Notes (Signed)
Patient complaining of rash and itching on face and between breasts.  Started 2 days ago.  Red flat areas noted near mouth.

## 2018-04-02 NOTE — Discharge Instructions (Addendum)
Your rash appears to be caused by insect bites. Take the prescription meds as directed. Follow-up with [facility] as needed.

## 2018-04-02 NOTE — ED Provider Notes (Signed)
[facility] Emergency Department Provider Note ____________________________________________  Time seen: 1125  I have reviewed the triage vital signs and the nursing notes.  HISTORY  Chief Complaint  Rash  HPI Monique Schaefer is a 52 y.o. female presents herself to the ED for evaluation of an itchy rash patient is noted to her legs and chest.  She reports she was in a homeless shelter for at least 2 nights prior to the onset of the rash.  She is unclear of any known exposures or contacts.  She admits to scratching to the point of excoriating and causing scabs to her legs and upper back.  She denies any fevers, chills, or sweats.  She has been taking Benadryl and using over-the-counter topical creams without significant benefit.  Past Medical History:  Diagnosis Date  . ADD (attention deficit disorder)   . Anxiety   . Depression   . Fibromyalgia   . GERD (gastroesophageal reflux disease)   . Neuropathy   . Schizophrenia ([place])   . Scoliosis     Patient Active Problem List   Diagnosis Date Noted  . Back pain 03/04/2018  . Health care maintenance 03/04/2018  . 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 Cocaine abuse ([place]) 02/21/2018  . Cannabis abuse 02/21/2018  . Homelessness 02/21/2018    Past Surgical History:  Procedure Laterality Date  . BILATERAL CARPAL TUNNEL RELEASE Bilateral   . CHOLECYSTECTOMY    . FEMUR SURGERY Left   . JOINT REPLACEMENT    . REPLACEMENT TOTAL KNEE BILATERAL Bilateral     Prior to Admission medications   Medication Sig Start Date End Date Taking? Authorizing Provider  amphetamine-dextroamphetamine (ADDERALL) 20 MG tablet  02/03/18   [provider]  baclofen (LIORESAL) 10 MG tablet Take 1 tablet (10 mg total) by mouth 3 (three) times daily. 03/02/18   Triplett, Johnette Abraham B, FNP  clonazePAM (KLONOPIN) 1 MG tablet Take by mouth.    [provider]  cyproheptadine (PERIACTIN) 4  MG tablet Take 1 tablet (4 mg total) by mouth 3 (three) times daily as needed for up to 7 days for allergies. 04/02/18 04/09/18  Lynden Flemmer, Dannielle Karvonen, PA-C  gabapentin (NEURONTIN) 300 MG capsule Take 1 capsule (300 mg total) by mouth 3 (three) times daily. 03/04/18   Doles-Johnson, Teah, NP  ibuprofen (ADVIL,MOTRIN) 200 MG tablet Take by mouth.    [provider]  ranitidine (ZANTAC) 150 MG tablet Take by mouth. 11/30/17   [provider]  ranitidine (ZANTAC) 150 MG tablet Take 1 tablet (150 mg total) by mouth 2 (two) times daily. 04/02/18 05/02/18  Trixy Loyola, Dannielle Karvonen, PA-C    Allergies Paxil [paroxetine hcl]; Morphine and related; and Oxycodone-acetaminophen  No family history on file.  Social History Social History   Tobacco Use  . Smoking status: Current Every Day Smoker  . Smokeless tobacco: Never Used  Substance Use Topics  . Alcohol use: Yes  . Drug use: Not Currently    Types: Marijuana    Review of Systems  Constitutional: Negative for fever. Eyes: Negative for visual changes. ENT: Negative for sore throat. Cardiovascular: Negative for chest pain. Respiratory: Negative for shortness of breath. Musculoskeletal: Negative for back pain. Skin: Positive for rash. Neurological: Negative for headaches, focal weakness or numbness. ____________________________________________  PHYSICAL EXAM:  VITAL SIGNS: ED Triage Vitals  Enc Vitals Group     BP 04/02/18 1025 (!) 124/104     Pulse Rate 04/02/18 1025 (!)  101     Resp 04/02/18 1025 16     Temp 04/02/18 1025 98.6 F (37 C)     Temp Source 04/02/18 1025 Oral     SpO2 04/02/18 1025 98 %     Weight 04/02/18 1027 200 lb (90.7 kg)     Height 04/02/18 1027 5' 7.5" (1.715 m)     Head Circumference --      Peak Flow --      Pain Score 04/02/18 1219 0     Pain Loc --      Pain Edu? --      Excl. in [place]? --     Constitutional: Alert and oriented. Well appearing and in no distress. Head: Normocephalic and  atraumatic. Eyes: Conjunctivae are normal. Normal extraocular movements Cardiovascular: Normal rate, regular rhythm. Normal distal pulses. Respiratory: Normal respiratory effort. No wheezes/rales/rhonchi. Gastrointestinal: Soft and nontender. No distention. Musculoskeletal: Nontender with normal range of motion in all extremities.  Neurologic:  Normal gait without ataxia. Normal speech and language. No gross focal neurologic deficits are appreciated. Skin:  Skin is warm, dry and intact.  Patient with multiple scattered excoriated papules over the lower extremities, chest, and upper back.  No blister formation, pustules, vesicles, or desquamation appreciated. Psychiatric: Mood and affect are normal. Patient exhibits appropriate insight and judgment. ____________________________________________  PROCEDURES  Procedures Cyproheptadine 4 mg PO Famotidine 40 mg PO Decadron 10 mg PO ____________________________________________  INITIAL IMPRESSION / ASSESSMENT AND PLAN / ED COURSE  ED evaluation of a nonspecific rash that may represent an insect (bedbug) exposure.  Patient reports improvement of her symptoms after ED medication ministration.  She will be discharged with prescriptions for cyproheptadine ranitidine to dose for histamine blockade.  Patient will follow-up with local community clinic for ongoing symptoms. ____________________________________________  FINAL CLINICAL IMPRESSION(S) / ED DIAGNOSES  Final diagnoses:  Rash and nonspecific skin eruption  Insect bite, unspecified site, initial encounter      Melvenia Needles, PA-C 04/02/18 1851    Arta Silence, MD 04/03/18 1103

## 2018-04-02 NOTE — ED Notes (Signed)
See triage note  Presents with rash  States she recently moved out of homeless shelter  But developed a rash to bilateral lower legs ,arms and chest  Pos itching   And some areas are painful

## 2018-04-06 ENCOUNTER — Ambulatory Visit: Payer: Self-pay

## 2018-04-07 ENCOUNTER — Ambulatory Visit: Payer: Self-pay | Admitting: Pharmacy Technician

## 2018-04-07 DIAGNOSIS — Z79899 Other long term (current) drug therapy: Secondary | ICD-10-CM

## 2018-04-07 NOTE — Progress Notes (Signed)
  Completed [facility] application and contract.  Patient agreed to all terms of the [facility] contract.    Patient approved to receive medication assistance at [facility] through 2019, as long as eligibility criteria continues to be met.    Provided patient with community resource material based on her particular needs.    Provided patient with phone numbers for [facility] and [facility].  Provided patient with information about [facility].  [facility]

## 2018-04-19 ENCOUNTER — Emergency Department: Payer: Self-pay

## 2018-04-19 ENCOUNTER — Emergency Department
Admission: EM | Admit: 2018-04-19 | Discharge: 2018-04-19 | Disposition: A | Discharge status: Home / Self Care | Payer: Self-pay | Attending: Student in an Organized Health Care Education/Training Program | Admitting: Student in an Organized Health Care Education/Training Program

## 2018-04-19 ENCOUNTER — Encounter: Payer: Self-pay | Admitting: Intensive Care

## 2018-04-19 ENCOUNTER — Other Ambulatory Visit: Payer: Self-pay

## 2018-04-19 DIAGNOSIS — X58XXXA Exposure to other specified factors, initial encounter: Secondary | ICD-10-CM | POA: Insufficient documentation

## 2018-04-19 DIAGNOSIS — Z79899 Other long term (current) drug therapy: Secondary | ICD-10-CM | POA: Insufficient documentation

## 2018-04-19 DIAGNOSIS — S39012A Strain of muscle, fascia and tendon of lower back, initial encounter: Secondary | ICD-10-CM | POA: Insufficient documentation

## 2018-04-19 DIAGNOSIS — Y929 Unspecified place or not applicable: Secondary | ICD-10-CM | POA: Insufficient documentation

## 2018-04-19 DIAGNOSIS — Y999 Unspecified external cause status: Secondary | ICD-10-CM | POA: Insufficient documentation

## 2018-04-19 DIAGNOSIS — Y939 Activity, unspecified: Secondary | ICD-10-CM | POA: Insufficient documentation

## 2018-04-19 MED ORDER — BACLOFEN 10 MG PO TABS: 10.0000 mg | ORAL_TABLET | Freq: Three times a day (TID) | ORAL | 0 refills | Status: DC

## 2018-04-19 MED ORDER — IBUPROFEN 600 MG PO TABS
600.0000 mg | ORAL_TABLET | Freq: Once | ORAL | Status: AC
  Administered 2018-04-19: 600 mg via ORAL
  Filled 2018-04-19: qty 1

## 2018-04-19 MED ORDER — MELOXICAM 15 MG PO TABS: 15.0000 mg | ORAL_TABLET | Freq: Every day | ORAL | 2 refills | Status: DC

## 2018-04-19 MED ORDER — CYCLOBENZAPRINE HCL 10 MG PO TABS
5.0000 mg | ORAL_TABLET | Freq: Once | ORAL | Status: AC
  Administered 2018-04-19: 5 mg via ORAL
  Filled 2018-04-19: qty 1

## 2018-04-19 NOTE — ED Triage Notes (Addendum)
Patient reports picking up a box at work and felt her back pop. Ever since experiencing extreme back pain. Ambulatory with no problems. Pt is unsure if she wants this to be workmans comp or not

## 2018-04-19 NOTE — Discharge Instructions (Signed)
By ice to all areas that hurt.  Return emergency department if worsening.  See your regular doctor or the acute care if not better in 5 to 7 days.

## 2018-04-19 NOTE — ED Provider Notes (Signed)
[facility] Emergency Department Provider Note  ____________________________________________   First MD Initiated Contact with Patient 04/19/18 1835     (approximate)  I have reviewed the triage vital signs and the nursing notes.   HISTORY  Chief Complaint Back Pain    HPI Monique Schaefer is a 52 y.o. female presents emergency department complaining of right sided back pain.  States she was at work and went to pick up a box and felt a pop in her back.  She states the pain is in the muscle adjacent to the lower back and ribs.  She did not take any medication for pain prior to arrival.  She did not apply ice.  She denies any other issues at this time.  Past Medical History:  Diagnosis Date  . ADD (attention deficit disorder)   . Anxiety   . Depression   . Fibromyalgia   . GERD (gastroesophageal reflux disease)   . Neuropathy   . Schizophrenia ([place])   . Scoliosis     Patient Active Problem List   Diagnosis Date Noted  . Back pain 03/04/2018  . Health care maintenance 03/04/2018  . 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 Cocaine abuse ([place]) 02/21/2018  . Cannabis abuse 02/21/2018  . Homelessness 02/21/2018    Past Surgical History:  Procedure Laterality Date  . BILATERAL CARPAL TUNNEL RELEASE Bilateral   . CHOLECYSTECTOMY    . FEMUR SURGERY Left   . JOINT REPLACEMENT    . REPLACEMENT TOTAL KNEE BILATERAL Bilateral     Prior to Admission medications   Medication Sig Start Date End Date Taking? Authorizing Provider  amphetamine-dextroamphetamine (ADDERALL) 20 MG tablet  02/03/18   [provider]  baclofen (LIORESAL) 10 MG tablet Take 1 tablet (10 mg total) by mouth 3 (three) times daily. 04/19/18   , Linden Dolin, PA-C  clonazePAM (KLONOPIN) 1 MG tablet Take by mouth.    [provider]  cyproheptadine (PERIACTIN) 4 MG tablet Take 1 tablet (4 mg total) by mouth 3 (three) times daily  as needed for up to 7 days for allergies. 04/02/18 04/09/18  Menshew, Dannielle Karvonen, PA-C  gabapentin (NEURONTIN) 300 MG capsule Take 1 capsule (300 mg total) by mouth 3 (three) times daily. 03/04/18   Doles-Johnson, Teah, NP  ibuprofen (ADVIL,MOTRIN) 200 MG tablet Take by mouth.    [provider]  meloxicam (MOBIC) 15 MG tablet Take 1 tablet (15 mg total) by mouth daily. 04/19/18 04/19/19  , Linden Dolin, PA-C  ranitidine (ZANTAC) 150 MG tablet Take by mouth. 11/30/17   [provider]  ranitidine (ZANTAC) 150 MG tablet Take 1 tablet (150 mg total) by mouth 2 (two) times daily. 04/02/18 05/02/18  Menshew, Dannielle Karvonen, PA-C    Allergies Paxil [paroxetine hcl]; Morphine and related; and Oxycodone-acetaminophen  History reviewed. No pertinent family history.  Social History Social History   Tobacco Use  . Smoking status: Current Every Day Smoker  . Smokeless tobacco: Never Used  Substance Use Topics  . Alcohol use: Yes  . Drug use: Not Currently    Types: Marijuana    Review of Systems  Constitutional: No fever/chills Eyes: No visual changes. ENT: No sore throat. Respiratory: Denies cough Genitourinary: Negative for dysuria. Musculoskeletal: Positive for back pain. Skin: Negative for rash.    ____________________________________________   PHYSICAL EXAM:  VITAL SIGNS: ED Triage Vitals  Enc Vitals Group     BP 04/19/18 1811 (!) 126/98  Pulse Rate 04/19/18 1811 89     Resp 04/19/18 1811 16     Temp 04/19/18 1811 98.9 F (37.2 C)     Temp Source 04/19/18 1811 Oral     SpO2 04/19/18 1811 97 %     Weight 04/19/18 1812 200 lb (90.7 kg)     Height 04/19/18 1812 5' 7.48" (1.714 m)     Head Circumference --      Peak Flow --      Pain Score 04/19/18 1811 7     Pain Loc --      Pain Edu? --      Excl. in [place]? --     Constitutional: Alert and oriented. Well appearing and in no acute distress. Eyes: Conjunctivae are normal.  Head: Atraumatic. Nose: No  congestion/rhinnorhea. Mouth/Throat: Mucous membranes are moist.   Cardiovascular: Normal rate, regular rhythm. Respiratory: Normal respiratory effort.  No retractions Abdomen: Is soft, nontender GU: deferred Musculoskeletal: FROM all extremities, warm and well perfused.  The lower thoracic and lumbar spine are mildly tender to palpation, the muscle spreading from the lower back into the area below the ribs is mildly tender to palpation.  The able to move without difficulty.  She is neurovascularly intact. Neurologic:  Normal speech and language.  Skin:  Skin is warm, dry and intact. No rash noted. Psychiatric: Mood and affect are normal. Speech and behavior are normal.  ____________________________________________   LABS (all labs ordered are listed, but only abnormal results are displayed)  Labs Reviewed - No data to display ____________________________________________   ____________________________________________  RADIOLOGY  X-ray lumbar spine is negative for any fractures or acute abnormality  ____________________________________________   PROCEDURES  Procedure(s) performed: Baclofen 10 mg p.o., ibuprofen 600 mg p.o.  Procedures    ____________________________________________   INITIAL IMPRESSION / ASSESSMENT AND PLAN / ED COURSE  Pertinent labs & imaging results that were available during my care of the patient were reviewed by me and considered in my medical decision making (see chart for details).  Patient is a 52 year old female presents emergency department complaining of back pain.  States she was at work went to pick up a box and felt a pop in her back.  She denies any numbness or tingling.  She does states pain radiates to her leg.  On physical exam patient appears well is able to move easily.  Lumbar spine is a little tender in the upper aspect.  The muscle is tender that radiates from the lumbar spine around to the words the abdomen and the flank area.   She is neurovascularly intact.  X-ray of the lumbar spine does not show any acute abnormalities.  Explained the x-ray results to the patient.  She was given a baclofen and ibuprofen while here in the emergency department.  She was given instructions on how to care for her back.  She was given a prescription for baclofen and meloxicam.  She is follow-up with her regular doctor or the acute care if not better 5 7 days.  Apply ice to all areas that hurt.  She states she understands comply with our instructions.  She is discharged in stable condition     As part of my medical decision making, I reviewed the following data within the [place] notes reviewed and incorporated, Old chart reviewed, Radiograph reviewed x-ray of the lumbar spine is negative for any acute abnormality, Notes from prior ED visits and [place] Controlled Substance Database  ____________________________________________  FINAL CLINICAL IMPRESSION(S) / ED DIAGNOSES  Final diagnoses:  Strain of lumbar region, initial encounter      NEW MEDICATIONS STARTED DURING THIS VISIT:  Discharge Medication List as of 04/19/2018  7:53 PM    START taking these medications   Details  meloxicam (MOBIC) 15 MG tablet Take 1 tablet (15 mg total) by mouth daily., Starting Mon 04/19/2018, Until Tue 04/19/2019, Print         Note:  This document was prepared using Dragon voice recognition software and may include unintentional dictation errors.    Versie Starks, PA-C 04/19/18 2030    Merlyn Lot, MD 04/19/18 [phone]

## 2018-04-26 ENCOUNTER — Telehealth: Payer: Self-pay | Admitting: Pharmacy Technician

## 2018-04-26 NOTE — Telephone Encounter (Signed)
Patient had seen Dr. Candelaria Stagers at [facility].  Patient inquiring about picking-up prescriptions prescribed by Dr. Candelaria Stagers.  No prescriptions sent by Dr. Candelaria Stagers.  Called [facility] and spoke with Dr. Pauline Aus nurse.  Dr. Candelaria Stagers had not finished pt's final notes and has patients to see all day.  Prescriptions for patient will be sent to [facility] within 24 hours.  Provided this information to patient.  Patient stated that she would call back this afternoon to see if [facility] has received prescriptions.  [facility]

## 2018-04-27 ENCOUNTER — Other Ambulatory Visit: Payer: Self-pay | Admitting: Adult Health Nurse Practitioner

## 2018-05-04 ENCOUNTER — Ambulatory Visit: Payer: Self-pay | Admitting: Adult Health Nurse Practitioner

## 2018-05-04 DIAGNOSIS — Z205 Contact with and (suspected) exposure to viral hepatitis: Secondary | ICD-10-CM

## 2018-05-04 DIAGNOSIS — G629 Polyneuropathy, unspecified: Secondary | ICD-10-CM

## 2018-05-04 DIAGNOSIS — M79671 Pain in right foot: Secondary | ICD-10-CM | POA: Insufficient documentation

## 2018-05-04 DIAGNOSIS — R7989 Other specified abnormal findings of blood chemistry: Secondary | ICD-10-CM

## 2018-05-04 DIAGNOSIS — M79672 Pain in left foot: Principal | ICD-10-CM

## 2018-05-04 MED ORDER — GABAPENTIN 300 MG PO CAPS: 300.0000 mg | ORAL_CAPSULE | Freq: Three times a day (TID) | ORAL | 1 refills | Status: DC

## 2018-05-04 NOTE — Progress Notes (Signed)
Subjective:    Patient ID: Monique Schaefer, female    DOB: 07/26/1966, 52 y.o.   MRN: [id number]  HPI  Monique Schaefer is a 52 yo F here for f/u for Hep C test. She presents with c/c of foot pain. She was seen in the ED on 7/22 for back pain. Pt has been previously been seen by our orthopedic but she states "he did nothing" for her pain. Pt reports constant burning pain, swelling, and "collapsing" in arches in bilateral feet. She is on her feet for her job but pain is worse without shoes. She reports hx of a broken toe. She reports pain is worse on dorsal side. Foot pain has been occurring for 4-5 years but it is progressively getting worse.  She has been taking meds for Neuropathy with little relief.   Hep C results have not come in. Pt reports ex-husband had hep C and had a test done in the past. She also complains of abdominal pain.   Patient Active Problem List   Diagnosis Date Noted  . Bilateral foot pain 05/04/2018  . Neuropathy 05/04/2018  . Elevated TSH 05/04/2018  . Back pain 03/04/2018  . Health care maintenance 03/04/2018  . 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 Cocaine abuse ([place]) 02/21/2018  . Cannabis abuse 02/21/2018  . Homelessness 02/21/2018   Allergies as of 05/04/2018      Reactions   Paxil [paroxetine Hcl] Swelling   Mango Flavor Hives   Morphine And Related Itching   Oxycodone-acetaminophen Itching      Medication List        Accurate as of 05/04/18  6:07 PM. Always use your most recent med list.          amphetamine-dextroamphetamine 20 MG tablet Commonly known as:  ADDERALL   baclofen 10 MG tablet Commonly known as:  LIORESAL Take 1 tablet (10 mg total) by mouth 3 (three) times daily.   clonazePAM 1 MG tablet Commonly known as:  KLONOPIN Take by mouth.   cyproheptadine 4 MG tablet Commonly known as:  PERIACTIN Take 1 tablet (4 mg total) by mouth 3 (three) times daily as needed for up to 7 days for  allergies.   gabapentin 300 MG capsule Commonly known as:  NEURONTIN TAKE ONE CAPSULE BY MOUTH 3 TIMES A DAY   ibuprofen 200 MG tablet Commonly known as:  ADVIL,MOTRIN Take by mouth.   meloxicam 15 MG tablet Commonly known as:  MOBIC Take 1 tablet (15 mg total) by mouth daily.   ZANTAC 150 MG tablet Generic drug:  ranitidine Take by mouth.   ranitidine 150 MG tablet Commonly known as:  ZANTAC Take 1 tablet (150 mg total) by mouth 2 (two) times daily.        Review of Systems  All other systems reviewed and are negative.  TSH elevated. Retest for hypothyroidism. Pt has not had prev dx.     Objective:   Physical Exam  Constitutional: She is oriented to person, place, and time. She appears well-developed and well-nourished.  Cardiovascular: Normal rate, regular rhythm and normal heart sounds.  Pulses:      Dorsalis pedis pulses are 2+ on the right side, and 2+ on the left side.  Pulmonary/Chest: Effort normal and breath sounds normal.  Musculoskeletal:       Right foot: There is normal range of motion.       Left foot: There is normal range of motion.  Neurological: She  is alert and oriented to person, place, and time.  Skin: Skin is warm and dry.  Psychiatric: Her behavior is normal. Judgment and thought content normal.  Vitals reviewed.  Bilateral feet- nontender to touch. Good color and sensation.   BP 133/88 (BP Location: Left Arm, Patient Position: Sitting)   Pulse 84   Temp 98.3 F (36.8 C) (Oral)   Ht 5\' 5"  (1.651 m)   Wt 207 lb (93.9 kg)   LMP  (LMP Unknown)   BMI 34.45 kg/m        Assessment & Plan:   Bilateral foot pain: Refer to podiatrist for pain Instructed her to wear arch supports and to roll feet on frozen water bottles.  Hep C: Draw labs today. Refer to GI. Gave pt [facility] application.    Elevated TSH: Retest TSH today. Will evaluate need for treatment after results.   F/u in 1 mo for lab results of Hep C and TSH.

## 2018-05-06 ENCOUNTER — Encounter: Payer: Self-pay | Admitting: Licensed Clinical Social Worker

## 2018-05-06 LAB — TSH: TSH: 0.458 u[IU]/mL (ref 0.450–4.500)

## 2018-05-06 LAB — HEPATITIS C VRS RNA DETECT BY PCR-QUAL: HCV RNA NAA Qualitative: POSITIVE — AB

## 2018-06-01 ENCOUNTER — Emergency Department
Admission: EM | Admit: 2018-06-01 | Discharge: 2018-06-01 | Disposition: A | Discharge status: Home / Self Care | Payer: Self-pay | Attending: Emergency Medicine | Admitting: Emergency Medicine

## 2018-06-01 ENCOUNTER — Other Ambulatory Visit: Payer: Self-pay

## 2018-06-01 DIAGNOSIS — G609 Hereditary and idiopathic neuropathy, unspecified: Secondary | ICD-10-CM | POA: Insufficient documentation

## 2018-06-01 DIAGNOSIS — Z79899 Other long term (current) drug therapy: Secondary | ICD-10-CM | POA: Insufficient documentation

## 2018-06-01 DIAGNOSIS — L259 Unspecified contact dermatitis, unspecified cause: Secondary | ICD-10-CM | POA: Insufficient documentation

## 2018-06-01 DIAGNOSIS — F17200 Nicotine dependence, unspecified, uncomplicated: Secondary | ICD-10-CM | POA: Insufficient documentation

## 2018-06-01 MED ORDER — MICONAZOLE NITRATE 2 % EX CREA: 1.0000 "application " | TOPICAL_CREAM | Freq: Two times a day (BID) | CUTANEOUS | 0 refills | Status: DC

## 2018-06-01 MED ORDER — GABAPENTIN 100 MG PO CAPS: 100.0000 mg | ORAL_CAPSULE | Freq: Three times a day (TID) | ORAL | 0 refills | Status: DC

## 2018-06-01 MED ORDER — MICONAZOLE NITRATE 2 % EX CREA: 1.0000 "application " | TOPICAL_CREAM | Freq: Two times a day (BID) | CUTANEOUS | 0 refills | Status: AC

## 2018-06-01 NOTE — ED Provider Notes (Signed)
[facility] Emergency Department Provider Note ____________________________________________  Time seen: 1737  I have reviewed the triage vital signs and the nursing notes.  HISTORY  Chief Complaint  Foot Pain and Rash  HPI Monique Schaefer is a 52 y.o. female who presents herself to the ED for evaluation of chronic ongoing bilateral foot pain.  Patient describes numbness, tingling, and hypersensitivity to the feet.  She also reports some dependent edema at the end of her workday.  She is been evaluated by her primary provider who suggested that she may have bone spurs, given her history of significant arthropathy.  Patient currently takes gabapentin at 300 mg 3 times daily, recently increased on a visit, primarily for her nerve pain.  She denies any recent injury, accident, or trauma.  She also denies any foot drop, leg weakness, or distal paresthesias.  The patient also has a concern for a red scaly rash to the seems that appeared around her mouth and the nape of her neck.  According to the patient, one of her coworkers has a similar peeling rash to his hands, and she is concerned that she may have gotten exposed by him touching utensils in the restaurant.  She denies any fevers, chills, or sweats.  She is been applying topical cortisone cream and triple antibiotic ointment but denies any change in the rash.  She denies that the rash is itchy only notes that it is red, dry, and scaly.  Past Medical History:  Diagnosis Date  . ADD (attention deficit disorder)   . Anxiety   . Depression   . Fibromyalgia   . GERD (gastroesophageal reflux disease)   . Neuropathy   . Schizophrenia ([place])   . Scoliosis     Patient Active Problem List   Diagnosis Date Noted  . Bilateral foot pain 05/04/2018  . Neuropathy 05/04/2018  . Elevated TSH 05/04/2018  . Exposure to hepatitis C 05/04/2018  . Back pain 03/04/2018  . Health care maintenance 03/04/2018  . Adjustment disorder with  mixed disturbance of emotions and conduct 02/21/2018  . Cocaine abuse ([place]) 02/21/2018  . Cannabis abuse 02/21/2018  . Homelessness 02/21/2018    Past Surgical History:  Procedure Laterality Date  . BILATERAL CARPAL TUNNEL RELEASE Bilateral   . CHOLECYSTECTOMY    . FEMUR SURGERY Left   . JOINT REPLACEMENT    . REPLACEMENT TOTAL KNEE BILATERAL Bilateral     Prior to Admission medications   Medication Sig Start Date End Date Taking? Authorizing Provider  amphetamine-dextroamphetamine (ADDERALL) 20 MG tablet  02/03/18   [provider]  baclofen (LIORESAL) 10 MG tablet Take 1 tablet (10 mg total) by mouth 3 (three) times daily. 04/19/18   Fisher, Linden Dolin, PA-C  clonazePAM (KLONOPIN) 1 MG tablet Take by mouth.    [provider]  gabapentin (NEURONTIN) 100 MG capsule Take 1 capsule (100 mg total) by mouth 3 (three) times daily. Increase your base dose every 2 days as discussed. 06/01/18 07/01/18  Alaysia Lightle, Dannielle Karvonen, PA-C  gabapentin (NEURONTIN) 300 MG capsule Take 1 capsule (300 mg total) by mouth 3 (three) times daily. 05/04/18   Doles-Johnson, Teah, NP  ibuprofen (ADVIL,MOTRIN) 200 MG tablet Take by mouth.    [provider]  miconazole (MICONAZOLE ANTIFUNGAL) 2 % cream Apply 1 application topically 2 (two) times daily for 15 days. 06/01/18 06/16/18  Aubrielle Stroud, Dannielle Karvonen, PA-C  ranitidine (ZANTAC) 150 MG tablet Take 1 tablet (150 mg total) by mouth 2 (two) times  daily. 04/02/18 05/02/18  Kelsey Edman, Dannielle Karvonen, PA-C    Allergies Paxil [paroxetine hcl]; Mango flavor; Morphine and related; and Oxycodone-acetaminophen  History reviewed. No pertinent family history.  Social History Social History   Tobacco Use  . Smoking status: Current Every Day Smoker  . Smokeless tobacco: Never Used  Substance Use Topics  . Alcohol use: Not Currently    Frequency: Never  . Drug use: Not Currently    Types: Marijuana    Review of Systems  Constitutional: Negative for  fever. Eyes: Negative for visual changes. ENT: Negative for sore throat. Cardiovascular: Negative for chest pain. Respiratory: Negative for shortness of breath. Gastrointestinal: Negative for abdominal pain, vomiting and diarrhea. Genitourinary: Negative for dysuria. Musculoskeletal: Negative for back pain. Skin: Positive for rash. Neurological: Negative for headaches, focal weakness or numbness.  Paresthesias to the bilateral feet as noted above. ____________________________________________  PHYSICAL EXAM:  VITAL SIGNS: ED Triage Vitals  Enc Vitals Group     BP 06/01/18 1635 (!) 137/100     Pulse Rate 06/01/18 1635 88     Resp 06/01/18 1635 20     Temp 06/01/18 1635 98.5 F (36.9 C)     Temp Source 06/01/18 1635 Oral     SpO2 06/01/18 1635 97 %     Weight 06/01/18 1636 200 lb (90.7 kg)     Height 06/01/18 1636 5\' 5"  (1.651 m)     Head Circumference --      Peak Flow --      Pain Score 06/01/18 1636 7     Pain Loc --      Pain Edu? --      Excl. in [place]? --     Constitutional: Alert and oriented. Well appearing and in no distress. Head: Normocephalic and atraumatic. Eyes: Conjunctivae are normal. PERRL. Normal extraocular movements Mouth/Throat: Mucous membranes are moist. Cardiovascular: Normal rate, regular rhythm. Normal distal pulses. Respiratory: Normal respiratory effort. Musculoskeletal: Nontender with normal range of motion in all extremities.  Neurologic: Cranial nerves II through XII grossly intact. Normal gross sensation. Normal speech and language. No gross focal neurologic deficits are appreciated. Skin:  Skin is warm, dry and intact. No rash noted.  Patient with multiple erythematous macular circular eruptions around the chin and lip.  The overlying skin is scaly and peeling, but no induration, blisters, vesicles, or excoriations are appreciated. Psychiatric: Mood is tearful and affect is anxious. Patient exhibits appropriate insight and  judgment. ____________________________________________  INITIAL IMPRESSION / ASSESSMENT AND PLAN / ED COURSE  Patient with ED evaluation of chronic bilateral foot nerve irritation, hypersensitivity, tingling and paresthesias likely consistent with a peripheral neuropathy.  She also has some indication of some peripheral edema.  Patient is advised to consider using compression socks for her daily activities.  Her work requires prolonged standing walking, as a Passenger transport manager.  Patient is advised to increase her gabapentin slowly over the next several weeks.  A prescription for 100 mg gabapentin will be provided, the patient will use to up dose her current 300 mg 3 times daily dose.  She is given instruction on how to up dose of medication safely.  She is also given a prescription for miconazole to apply topically to the face and neck for her nonspecific rash which may indicate a topical fungal infection.  She will follow-up with her primary provider return to the ED as discussed. ____________________________________________  FINAL CLINICAL IMPRESSION(S) / ED DIAGNOSES  Final diagnoses:  Idiopathic peripheral neuropathy  Contact dermatitis,  unspecified contact dermatitis type, unspecified trigger      Melvenia Needles, PA-C 06/01/18 1756    Harvest Dark, MD 06/01/18 1900

## 2018-06-01 NOTE — ED Triage Notes (Addendum)
Pt states she works at [employer] and states everyone is getting a rash. States her rash in on her face x few weeks. Has make-up on. Also c/o of bilat top foot pain x months. States tingling to feet after working. Ambulatory. Pt states her roommate gave her hydrocodone PTA.

## 2018-06-01 NOTE — ED Notes (Signed)
See triage note  Presents with bilateral foot pain for over 1 month states she thinks that the top of her foot swells and burning to the bottoms of feet  States she has been seen at [facility] and dx'd with bone spurs  States this feels different that that pt is tearful  On arrival

## 2018-06-01 NOTE — Discharge Instructions (Addendum)
Take the Gabapentin 100 mg tabs and increase your base dose as discussed. Apply the antifungal cream as directed. Follow-up with your provider for ongoing symptoms.

## 2018-06-08 ENCOUNTER — Ambulatory Visit: Payer: Self-pay | Admitting: Family Medicine

## 2018-06-08 VITALS — BP 108/76 | HR 77 | Temp 98.2°F | Wt 211.5 lb

## 2018-06-08 DIAGNOSIS — M5442 Lumbago with sciatica, left side: Secondary | ICD-10-CM

## 2018-06-08 DIAGNOSIS — Z205 Contact with and (suspected) exposure to viral hepatitis: Secondary | ICD-10-CM

## 2018-06-08 DIAGNOSIS — M79671 Pain in right foot: Secondary | ICD-10-CM

## 2018-06-08 DIAGNOSIS — K732 Chronic active hepatitis, not elsewhere classified: Secondary | ICD-10-CM | POA: Insufficient documentation

## 2018-06-08 DIAGNOSIS — G629 Polyneuropathy, unspecified: Secondary | ICD-10-CM

## 2018-06-08 DIAGNOSIS — F1411 Cocaine abuse, in remission: Secondary | ICD-10-CM

## 2018-06-08 DIAGNOSIS — M79672 Pain in left foot: Secondary | ICD-10-CM

## 2018-06-08 DIAGNOSIS — R32 Unspecified urinary incontinence: Secondary | ICD-10-CM

## 2018-06-08 DIAGNOSIS — L710 Perioral dermatitis: Secondary | ICD-10-CM

## 2018-06-08 DIAGNOSIS — R1012 Left upper quadrant pain: Secondary | ICD-10-CM

## 2018-06-08 DIAGNOSIS — M5441 Lumbago with sciatica, right side: Secondary | ICD-10-CM

## 2018-06-08 DIAGNOSIS — R590 Localized enlarged lymph nodes: Secondary | ICD-10-CM | POA: Insufficient documentation

## 2018-06-08 DIAGNOSIS — G8929 Other chronic pain: Secondary | ICD-10-CM

## 2018-06-08 MED ORDER — RANITIDINE HCL 150 MG PO TABS: 150.0000 mg | ORAL_TABLET | Freq: Two times a day (BID) | ORAL | 0 refills | Status: DC

## 2018-06-08 MED ORDER — OXYBUTYNIN CHLORIDE ER 5 MG PO TB24: 5.0000 mg | ORAL_TABLET | Freq: Every day | ORAL | 0 refills | Status: DC

## 2018-06-08 MED ORDER — IBUPROFEN 200 MG PO TABS: 600.0000 mg | ORAL_TABLET | Freq: Three times a day (TID) | ORAL | Status: DC | PRN

## 2018-06-08 MED ORDER — METRONIDAZOLE 1 % EX CREA: TOPICAL_CREAM | Freq: Every day | CUTANEOUS | 0 refills | Status: DC

## 2018-06-08 NOTE — Assessment & Plan Note (Addendum)
Will refer to neurologist and start work-up to include UPEP, SPEP, B12, heavy metals, etc; patient is not due to run out of gabapentin until the first week of October; I will not refill any today and will not increase the dose today; further work-up and/or referral based on lab results; return in two weeks to go over; she may need EMG/NCS unless neurology orders that, but will defer to next provider (in case the UPEP or SPEP or B12 or something else obvious comes back abnormal)

## 2018-06-08 NOTE — Progress Notes (Signed)
BP 108/76 (BP Location: Left Arm)   Pulse 77   Temp 98.2 F (36.8 C)   Wt 211 lb 8 oz (95.9 kg)   LMP  (LMP Unknown)   BMI 35.20 kg/m    Subjective:    Patient ID: Monique Schaefer, female    DOB: 1966/06/11, 52 y.o.   MRN: [id number]  HPI: Monique Schaefer is a 52 y.o. female  Chief Complaint  Patient presents with  . Follow-up    bladder control issues, sleep disturbances, facial lesion    HPI Patient is here for follow-up; she has pain in her feet, diagnosed with neuropathy in her mid 107's; she cannot walk hardly without taking the gabapentin; she has nerve damage in her feet; her feet tingle and burn constantly; back is bothering her too; xray was done July 22nd which showed mild degenerative changes of lumbar spine, grade 1 anterolisthesis L4-L5, trace retrolisthesis L1-L2, mild disc height loss at 3 levels; she was seen in the ER last week and they added 100 mg to the current 300 mg TID gabapentin; she is now taking 400 mg TID and should have enough of both of those until the first week of October  She has bladder control issues; just bends over and urinates on herself; strong smell to the urine; drinks plenty of water; no burning; long time ago she was on bladder control medicine; weak bladder; does not remember what she used to take  She has trouble sleeping; wonders why no one will give her sleeping pills  She reports a facial lesions on the sides of the mouth; dry; like sandpaper; no itching; over a month; they gave her miconazole and that is not helping her  She has hepatitis C; she has known about hepatitis C for over a year; she never took any treatment for it; one of the reason she is here, she says; she denies any hx of IVDU  She has not used crack cocaine in 17 years; however, her urine drug screen was positive for several drugs in May of this year; she says she is not doing any more of these she says; that she was at a party one time and that happened  Taking  ibuprofen 4 tablets at a time (200 mg strength), doing that 4 times a day (I immeidately cautioned her about this, too much, risk of damage to kidneys); she says that's all she has; she was referred to a foot doctor but can't afford to go  She had CT scan in Jan 2019; could barely swallow; multiple nodes; flu like illness  She says she is already seeing a counselor; she is not taking benzos or Adderall now; she doesn't have anyone to prescribe it for her  Relevant past medical, surgical, family and social history reviewed Past Medical History:  Diagnosis Date  . ADD (attention deficit disorder)   . Anxiety   . Depression   . Fibromyalgia   . GERD (gastroesophageal reflux disease)   . Neuropathy   . Schizophrenia ([place])   . Scoliosis    Past Surgical History:  Procedure Laterality Date  . BILATERAL CARPAL TUNNEL RELEASE Bilateral   . CHOLECYSTECTOMY    . FEMUR SURGERY Left   . JOINT REPLACEMENT    . REPLACEMENT TOTAL KNEE BILATERAL Bilateral    History reviewed. No pertinent family history. Social History   Tobacco Use  . Smoking status: Current Every Day Smoker  . Smokeless tobacco: Never Used  . Tobacco comment: patient has  materials already  Substance Use Topics  . Alcohol use: Not Currently    Frequency: Never  . Drug use: Not Currently    Types: Marijuana  MD note: down to a pack every 2 - 1/2 days  Interim medical history since last visit reviewed. Allergies and medications reviewed  Review of Systems Per HPI unless specifically indicated above     Objective:    BP 108/76 (BP Location: Left Arm)   Pulse 77   Temp 98.2 F (36.8 C)   Wt 211 lb 8 oz (95.9 kg)   LMP  (LMP Unknown)   BMI 35.20 kg/m   Wt Readings from Last 3 Encounters:  06/08/18 211 lb 8 oz (95.9 kg)  06/01/18 200 lb (90.7 kg)  05/04/18 207 lb (93.9 kg)    Physical Exam  Constitutional: She appears well-developed and well-nourished. No distress.  obese  HENT:  Head: Normocephalic and  atraumatic.  Eyes: EOM are normal. No scleral icterus.  Neck: No thyromegaly present.  Cardiovascular: Normal rate, regular rhythm and normal heart sounds.  No murmur heard. Pulmonary/Chest: Effort normal and breath sounds normal. No respiratory distress. She has no wheezes.  Abdominal: Soft. Bowel sounds are normal. She exhibits no distension. There is tenderness (LUQ).  Musculoskeletal: She exhibits no edema.  Neurological: She is alert. Gait (antalgic) abnormal.  Skin: Skin is warm and dry. Rash (along the sides of the mouth, nasolabial fold, erythematous papular rash) noted. She is not diaphoretic. No pallor.  Psychiatric: Her behavior is normal. Judgment and thought content normal. Her mood appears anxious.  Briefly anxious and tearful when discussing her need for the gabapentin, then anxious and tearful when discussing health concerns    Results for orders placed or performed in visit on 05/04/18  TSH  Result Value Ref Range   TSH 0.458 0.450 - 4.500 uIU/mL  Hepatitis c vrs RNA detect by PCR-qual  Result Value Ref Range   HCV RNA NAA Qualitative Positive (A) Negative      Assessment & Plan:   Problem List Items Addressed This Visit      Digestive   Chronic active hepatitis ([place])   Relevant Orders   Hepatitis C Genotype   Hepatitis C RNA quantitative     Nervous and Auditory   Neuropathy - Primary    Will refer to neurologist and start work-up to include UPEP, SPEP, B12, heavy metals, etc; patient is not due to run out of gabapentin until the first week of October; I will not refill any today and will not increase the dose today; further work-up and/or referral based on lab results; return in two weeks to go over; she may need EMG/NCS unless neurology orders that, but will defer to next provider (in case the UPEP or SPEP or B12 or something else obvious comes back abnormal)      Relevant Orders   Ambulatory referral to Neurology   Comprehensive metabolic panel   Vitamin  B12   Protein Electrophoresis, (serum)   UPEP/UIFE/Light Chains/TP, 24-Hr Ur   CBC with Differential/Platelet   Heavy Metals Profile, Urine     Immune and Lymphatic   Cervical lymphadenopathy    Seen on CT scan Jan 2019; will make sure lymph nodes are back to normal and that was just reactive; order [place]      Relevant Orders   US Soft Tissue Head/Neck     Other   Exposure to hepatitis C   Relevant Orders   Ambulatory referral to Gastroenterology  Bilateral foot pain    Patient reports neuropathy, but need to find out what the cause is; ordering tests, [facility]; patient says she cannot afford to see the foot doctor      Relevant Medications   ibuprofen (ADVIL,MOTRIN) 200 MG tablet   Back pain    Reviewed xray results from July lumbar films, reviewed them with her      Relevant Medications   ibuprofen (ADVIL,MOTRIN) 200 MG tablet    Other Visit Diagnoses    Urinary incontinence, unspecified type       check urine; Rx for medicine as she used to take   Relevant Medications   oxybutynin (DITROPAN-XL) 5 MG 24 hr tablet   Other Relevant Orders   Urinalysis w microscopic + reflex cultur   Hx of cocaine abuse       glad to hear that she is not actively using; however, her last two UDS were positive for multiple drugs; recheck today; offer help if actively in system   Relevant Orders   Urine Drug Screen w/Alc, no confirm   Perioral dermatitis       she has failed other treatments; will try metronidazole topically   LUQ pain       close f/u to go over this with provider at next visit; continue H2 blocker; will be seeing GI soon and may need EGD       Follow up plan: Return in about 2 weeks (around 06/22/2018) for follow-up visit.  An after-visit summary was printed and given to the patient at [facility].  Please see the patient instructions which may contain other information and recommendations beyond what is mentioned above in the assessment  and plan.  Meds ordered this encounter  Medications  . ibuprofen (ADVIL,MOTRIN) 200 MG tablet    Sig: Take 3-4 tablets (600-800 mg total) by mouth every 8 (eight) hours as needed.  . ranitidine (ZANTAC) 150 MG tablet    Sig: Take 1 tablet (150 mg total) by mouth 2 (two) times daily.    Dispense:  60 tablet    Refill:  0  . oxybutynin (DITROPAN-XL) 5 MG 24 hr tablet    Sig: Take 1 tablet (5 mg total) by mouth at bedtime.    Dispense:  30 tablet    Refill:  0  . metronidazole (NORITATE) 1 % cream    Sig: Apply topically daily. Very small amount, apply to affected skin on the face    Dispense:  60 g    Refill:  0   Orders Placed This Encounter  Procedures  . US Soft Tissue Head/Neck  . Comprehensive metabolic panel  . Urine Drug Screen w/Alc, no confirm  . Urinalysis w microscopic + reflex cultur  . Vitamin B12  . Protein Electrophoresis, (serum)  . UPEP/UIFE/Light Chains/TP, 24-Hr Ur  . CBC with Differential/Platelet  . Heavy Metals Profile, Urine  . Hepatitis C Genotype  . Hepatitis C RNA quantitative  . Ambulatory referral to Neurology  . Ambulatory referral to Gastroenterology  Face-to-face time with patient was more than 25 minutes, >50% time spent counseling and coordination of care  MD note: patient would not give a urine specimen to the lab technician; she therefore did NOT get the urinalysis, urine drug screen, and did not get a container for the UPEP; I would expect a UDS to be done prior to considering whether to approve additional gabapentin

## 2018-06-08 NOTE — Assessment & Plan Note (Signed)
Seen on CT scan Jan 2019; will make sure lymph nodes are back to normal and that was just reactive; order [place]

## 2018-06-08 NOTE — Patient Instructions (Addendum)
I do encourage you to quit smoking Call [phone] to sign up for smoking cessation classes You can call 1-800-QUIT-NOW to talk with a smoking cessation coach  Maximum amount of ibuprofen per day that you can take is 2400 mg; 800 mg at a time three times a day Let's get multiple labs today Continue the gabapentin for now   Insomnia Insomnia is a sleep disorder that makes it difficult to fall asleep or to stay asleep. Insomnia can cause tiredness (fatigue), low energy, difficulty concentrating, mood swings, and poor performance at work or school. There are three different ways to classify insomnia:  Difficulty falling asleep.  Difficulty staying asleep.  Waking up too early in the morning.  Any type of insomnia can be long-term (chronic) or short-term (acute). Both are common. Short-term insomnia usually lasts for three months or less. Chronic insomnia occurs at least three times a week for longer than three months. What are the causes? Insomnia may be caused by another condition, situation, or substance, such as:  Anxiety.  Certain medicines.  Gastroesophageal reflux disease (GERD) or other gastrointestinal conditions.  Asthma or other breathing conditions.  Restless legs syndrome, sleep apnea, or other sleep disorders.  Chronic pain.  Menopause. This may include hot flashes.  Stroke.  Abuse of alcohol, tobacco, or illegal drugs.  Depression.  Caffeine.  Neurological disorders, such as Alzheimer disease.  An overactive thyroid (hyperthyroidism).  The cause of insomnia may not be known. What increases the risk? Risk factors for insomnia include:  Gender. Women are more commonly affected than men.  Age. Insomnia is more common as you get older.  Stress. This may involve your professional or personal life.  Income. Insomnia is more common in people with lower income.  Lack of exercise.  Irregular work schedule or night shifts.  Traveling between  different time zones.  What are the signs or symptoms? If you have insomnia, trouble falling asleep or trouble staying asleep is the main symptom. This may lead to other symptoms, such as:  Feeling fatigued.  Feeling nervous about going to sleep.  Not feeling rested in the morning.  Having trouble concentrating.  Feeling irritable, anxious, or depressed.  How is this treated? Treatment for insomnia depends on the cause. If your insomnia is caused by an underlying condition, treatment will focus on addressing the condition. Treatment may also include:  Medicines to help you sleep.  Counseling or therapy.  Lifestyle adjustments.  Follow these instructions at home:  Take medicines only as directed by your health care provider.  Keep regular sleeping and waking hours. Avoid naps.  Keep a sleep diary to help you and your health care provider figure out what could be causing your insomnia. Include: ? When you sleep. ? When you wake up during the night. ? How well you sleep. ? How rested you feel the next day. ? Any side effects of medicines you are taking. ? What you eat and drink.  Make your bedroom a comfortable place where it is easy to fall asleep: ? Put up shades or special blackout curtains to block light from outside. ? Use a white noise machine to block noise. ? Keep the temperature cool.  Exercise regularly as directed by your health care provider. Avoid exercising right before bedtime.  Use relaxation techniques to manage stress. Ask your health care provider to suggest some techniques that may work well for you. These may include: ? Breathing exercises. ? Routines to release muscle tension. ? Visualizing peaceful  scenes.  Cut back on alcohol, caffeinated beverages, and cigarettes, especially close to bedtime. These can disrupt your sleep.  Do not overeat or eat spicy foods right before bedtime. This can lead to digestive discomfort that can make it hard for  you to sleep.  Limit screen use before bedtime. This includes: ? Watching TV. ? Using your smartphone, tablet, and computer.  Stick to a routine. This can help you fall asleep faster. Try to do a quiet activity, brush your teeth, and go to bed at the same time each night.  Get out of bed if you are still awake after 15 minutes of trying to sleep. Keep the lights down, but try reading or doing a quiet activity. When you feel sleepy, go back to bed.  Make sure that you drive carefully. Avoid driving if you feel very sleepy.  Keep all follow-up appointments as directed by your health care provider. This is important. Contact a health care provider if:  You are tired throughout the day or have trouble in your daily routine due to sleepiness.  You continue to have sleep problems or your sleep problems get worse. Get help right away if:  You have serious thoughts about hurting yourself or someone else. This information is not intended to replace advice given to you by your health care provider. Make sure you discuss any questions you have with your health care provider. Document Released: 09/12/2000 Document Revised: 02/15/2016 Document Reviewed: 06/16/2014 Elsevier Interactive Patient Education  [employer].

## 2018-06-08 NOTE — Assessment & Plan Note (Signed)
Patient reports neuropathy, but need to find out what the cause is; ordering tests, refer to neurology through [facility]; patient says she cannot afford to see the foot doctor

## 2018-06-08 NOTE — Assessment & Plan Note (Signed)
Reviewed xray results from July lumbar films, reviewed them with her

## 2018-06-09 LAB — COMPREHENSIVE METABOLIC PANEL
ALT: 22 IU/L (ref 0–32)
AST: 25 IU/L (ref 0–40)
Albumin/Globulin Ratio: 1.6 (ref 1.2–2.2)
Albumin: 3.9 g/dL (ref 3.5–5.5)
Alkaline Phosphatase: 98 IU/L (ref 39–117)
BUN/Creatinine Ratio: 27 — ABNORMAL HIGH (ref 9–23)
BUN: 23 mg/dL (ref 6–24)
Bilirubin Total: 0.2 mg/dL (ref 0.0–1.2)
CALCIUM: 9.1 mg/dL (ref 8.7–10.2)
CO2: 24 mmol/L (ref 20–29)
CREATININE: 0.86 mg/dL (ref 0.57–1.00)
Chloride: 103 mmol/L (ref 96–106)
GFR calc Af Amer: 90 mL/min/{1.73_m2} (ref >59)
GFR calc non Af Amer: 78 mL/min/{1.73_m2} (ref >59)
Globulin, Total: 2.4 g/dL (ref 1.5–4.5)
Glucose: 93 mg/dL (ref 65–99)
Potassium: 4.3 mmol/L (ref 3.5–5.2)
Sodium: 142 mmol/L (ref 134–144)
Total Protein: 6.3 g/dL (ref 6.0–8.5)

## 2018-06-09 LAB — CBC WITH DIFFERENTIAL/PLATELET
BASOS ABS: 0 10*3/uL (ref 0.0–0.2)
Basos: 1 %
EOS (ABSOLUTE): 0.2 10*3/uL (ref 0.0–0.4)
Eos: 4 %
HEMOGLOBIN: 13.2 g/dL (ref 11.1–15.9)
Hematocrit: 39 % (ref 34.0–46.6)
IMMATURE GRANS (ABS): 0 10*3/uL (ref 0.0–0.1)
Immature Granulocytes: 0 %
LYMPHS: 40 %
Lymphocytes Absolute: 2 10*3/uL (ref 0.7–3.1)
MCH: 30.7 pg (ref 26.6–33.0)
MCHC: 33.8 g/dL (ref 31.5–35.7)
MCV: 91 fL (ref 79–97)
MONOCYTES: 8 %
Monocytes Absolute: 0.4 10*3/uL (ref 0.1–0.9)
NEUTROS ABS: 2.3 10*3/uL (ref 1.4–7.0)
Neutrophils: 47 %
Platelets: 218 10*3/uL (ref 150–450)
RBC: 4.3 x10E6/uL (ref 3.77–5.28)
RDW: 13.4 % (ref 12.3–15.4)
WBC: 4.9 10*3/uL (ref 3.4–10.8)

## 2018-06-09 LAB — VITAMIN B12: VITAMIN B 12: 364 pg/mL (ref 232–1245)

## 2018-06-12 LAB — PROTEIN ELECTROPHORESIS, SERUM
A/G Ratio: 1.3 (ref 0.7–1.7)
ALPHA 1: 0.2 g/dL (ref 0.0–0.4)
ALPHA 2: 0.6 g/dL (ref 0.4–1.0)
Albumin ELP: 3.5 g/dL (ref 2.9–4.4)
Beta: 0.8 g/dL (ref 0.7–1.3)
Gamma Globulin: 1.1 g/dL (ref 0.4–1.8)
Globulin, Total: 2.7 g/dL (ref 2.2–3.9)
Total Protein: 6.2 g/dL (ref 6.0–8.5)

## 2018-06-12 LAB — HEPATITIS C GENOTYPE

## 2018-06-14 ENCOUNTER — Emergency Department: Payer: Self-pay

## 2018-06-14 ENCOUNTER — Emergency Department
Admission: EM | Admit: 2018-06-14 | Discharge: 2018-06-14 | Disposition: A | Discharge status: Home / Self Care | Payer: Self-pay | Attending: Emergency Medicine | Admitting: Emergency Medicine

## 2018-06-14 ENCOUNTER — Encounter: Payer: Self-pay | Admitting: Emergency Medicine

## 2018-06-14 ENCOUNTER — Other Ambulatory Visit: Payer: Self-pay

## 2018-06-14 DIAGNOSIS — W0110XA Fall on same level from slipping, tripping and stumbling with subsequent striking against unspecified object, initial encounter: Secondary | ICD-10-CM | POA: Insufficient documentation

## 2018-06-14 DIAGNOSIS — Z79899 Other long term (current) drug therapy: Secondary | ICD-10-CM | POA: Insufficient documentation

## 2018-06-14 DIAGNOSIS — Y999 Unspecified external cause status: Secondary | ICD-10-CM | POA: Insufficient documentation

## 2018-06-14 DIAGNOSIS — S2241XD Multiple fractures of ribs, right side, subsequent encounter for fracture with routine healing: Secondary | ICD-10-CM

## 2018-06-14 DIAGNOSIS — S20211A Contusion of right front wall of thorax, initial encounter: Secondary | ICD-10-CM

## 2018-06-14 DIAGNOSIS — F17200 Nicotine dependence, unspecified, uncomplicated: Secondary | ICD-10-CM | POA: Insufficient documentation

## 2018-06-14 DIAGNOSIS — Y92017 Garden or yard in single-family (private) house as the place of occurrence of the external cause: Secondary | ICD-10-CM | POA: Insufficient documentation

## 2018-06-14 DIAGNOSIS — S2241XA Multiple fractures of ribs, right side, initial encounter for closed fracture: Secondary | ICD-10-CM | POA: Insufficient documentation

## 2018-06-14 DIAGNOSIS — Z96653 Presence of artificial knee joint, bilateral: Secondary | ICD-10-CM | POA: Insufficient documentation

## 2018-06-14 DIAGNOSIS — Y9301 Activity, walking, marching and hiking: Secondary | ICD-10-CM | POA: Insufficient documentation

## 2018-06-14 LAB — BASIC METABOLIC PANEL
Anion gap: 6 (ref 5–15)
BUN: 14 mg/dL (ref 6–20)
CALCIUM: 8.5 mg/dL — AB (ref 8.9–10.3)
CO2: 24 mmol/L (ref 22–32)
CREATININE: 0.56 mg/dL (ref 0.44–1.00)
Chloride: 109 mmol/L (ref 98–111)
GFR calc Af Amer: >60 mL/min (ref >60)
GFR calc non Af Amer: >60 mL/min (ref >60)
GLUCOSE: 111 mg/dL — AB (ref 70–99)
Potassium: 4 mmol/L (ref 3.5–5.1)
Sodium: 139 mmol/L (ref 135–145)

## 2018-06-14 LAB — CBC WITH DIFFERENTIAL/PLATELET
BASOS PCT: 1 %
Basophils Absolute: 0 10*3/uL (ref 0–0.1)
EOS ABS: 0.1 10*3/uL (ref 0–0.7)
EOS PCT: 2 %
HCT: 40.8 % (ref 35.0–47.0)
Hemoglobin: 14.2 g/dL (ref 12.0–16.0)
Lymphocytes Relative: 21 %
Lymphs Abs: 1.3 10*3/uL (ref 1.0–3.6)
MCH: 32 pg (ref 26.0–34.0)
MCHC: 34.9 g/dL (ref 32.0–36.0)
MCV: 91.7 fL (ref 80.0–100.0)
MONO ABS: 0.4 10*3/uL (ref 0.2–0.9)
Monocytes Relative: 6 %
Neutro Abs: 4.1 10*3/uL (ref 1.4–6.5)
Neutrophils Relative %: 70 %
PLATELETS: 196 10*3/uL (ref 150–440)
RBC: 4.45 MIL/uL (ref 3.80–5.20)
RDW: 13.6 % (ref 11.5–14.5)
WBC: 5.9 10*3/uL (ref 3.6–11.0)

## 2018-06-14 MED ORDER — TRAMADOL HCL 50 MG PO TABS: 50.0000 mg | ORAL_TABLET | Freq: Four times a day (QID) | ORAL | 0 refills | Status: DC | PRN

## 2018-06-14 MED ORDER — PREDNISONE 10 MG (21) PO TBPK: ORAL_TABLET | ORAL | 0 refills | Status: DC

## 2018-06-14 MED ORDER — CYCLOBENZAPRINE HCL 10 MG PO TABS: 10.0000 mg | ORAL_TABLET | Freq: Three times a day (TID) | ORAL | 0 refills | Status: DC | PRN

## 2018-06-14 MED ORDER — IOHEXOL 300 MG/ML  SOLN
75.0000 mL | Freq: Once | INTRAMUSCULAR | Status: AC | PRN
  Administered 2018-06-14: 75 mL via INTRAVENOUS
  Filled 2018-06-14: qty 75

## 2018-06-14 MED ORDER — HYDROMORPHONE HCL 1 MG/ML IJ SOLN
1.0000 mg | Freq: Once | INTRAMUSCULAR | Status: AC
  Administered 2018-06-14: 1 mg via INTRAVENOUS
  Filled 2018-06-14: qty 1

## 2018-06-14 NOTE — ED Triage Notes (Signed)
Presents via ems   States she fell 2 days ago  Tripped over a small brick wall  Landed on right side  Having pain to lateral rib area  Pain increased with movement and inspiration   States she has been using ice/heat and IBU w/o relief  Tearful on arrival

## 2018-06-14 NOTE — ED Provider Notes (Signed)
[facility] Emergency Department Provider Note  ____________________________________________   First MD Initiated Contact with Patient 06/14/18 1008     (approximate)  I have reviewed the triage vital signs and the nursing notes.   HISTORY  Chief Complaint Fall    HPI Michaline Kindig is a 52 y.o. female complains of right side pain.  She states she fell over a barrier between her house and the next house.  She states that hurts to take a deep breath.  She cannot move her arm without pain in the right side.  She denies any chest pain or shortness of breath.  She states pain does radiate across the right side of the chest.  She states she is allergic to morphine because it makes her itch but Dilaudid helps.  Dilaudid does not make her itch    Past Medical History:  Diagnosis Date  . ADD (attention deficit disorder)   . Anxiety   . Depression   . Fibromyalgia   . GERD (gastroesophageal reflux disease)   . Neuropathy   . Schizophrenia ([place])   . Scoliosis     Patient Active Problem List   Diagnosis Date Noted  . Cervical lymphadenopathy 06/08/2018  . Chronic active hepatitis ([place]) 06/08/2018  . Bilateral foot pain 05/04/2018  . Neuropathy 05/04/2018  . Elevated TSH 05/04/2018  . Exposure to hepatitis C 05/04/2018  . Back pain 03/04/2018  . Health care maintenance 03/04/2018  . 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 Cocaine abuse ([place]) 02/21/2018  . Cannabis abuse 02/21/2018  . Homelessness 02/21/2018    Past Surgical History:  Procedure Laterality Date  . BILATERAL CARPAL TUNNEL RELEASE Bilateral   . CHOLECYSTECTOMY    . FEMUR SURGERY Left   . JOINT REPLACEMENT    . REPLACEMENT TOTAL KNEE BILATERAL Bilateral     Prior to Admission medications   Medication Sig Start Date End Date Taking? Authorizing Provider  cyclobenzaprine (FLEXERIL) 10 MG tablet Take 1 tablet (10 mg total) by mouth 3 (three)  times daily as needed. 06/14/18   Fisher, Linden Dolin, PA-C  gabapentin (NEURONTIN) 100 MG capsule Take 1 capsule (100 mg total) by mouth 3 (three) times daily. Increase your base dose every 2 days as discussed. 06/01/18 07/01/18  Menshew, Dannielle Karvonen, PA-C  gabapentin (NEURONTIN) 300 MG capsule Take 1 capsule (300 mg total) by mouth 3 (three) times daily. 05/04/18   Doles-Johnson, Teah, NP  ibuprofen (ADVIL,MOTRIN) 200 MG tablet Take 3-4 tablets (600-800 mg total) by mouth every 8 (eight) hours as needed. 06/08/18   Arnetha Courser, MD  miconazole (MICONAZOLE ANTIFUNGAL) 2 % cream Apply 1 application topically 2 (two) times daily for 15 days. 06/01/18 06/16/18  Menshew, Dannielle Karvonen, PA-C  oxybutynin (DITROPAN-XL) 5 MG 24 hr tablet Take 1 tablet (5 mg total) by mouth at bedtime. 06/08/18   Lada, Satira Anis, MD  predniSONE (STERAPRED UNI-PAK 21 TAB) 10 MG (21) TBPK tablet Take 6 pills on day one then decrease by 1 pill each day 06/14/18   Versie Starks, PA-C  ranitidine (ZANTAC) 150 MG tablet Take 1 tablet (150 mg total) by mouth 2 (two) times daily. 06/08/18 07/08/18  Arnetha Courser, MD  traMADol (ULTRAM) 50 MG tablet Take 1 tablet (50 mg total) by mouth every 6 (six) hours as needed. 06/14/18   Caryn Section Linden Dolin, PA-C    Allergies Paxil [paroxetine hcl]; Mango flavor; Morphine and related; and Oxycodone-acetaminophen  No family history on file.  Social History Social History   Tobacco Use  . Smoking status: Current Every Day Smoker  . Smokeless tobacco: Never Used  . Tobacco comment: patient has materials already  Substance Use Topics  . Alcohol use: Not Currently    Frequency: Never  . Drug use: Not Currently    Types: Marijuana    Review of Systems  Constitutional: No fever/chills Eyes: No visual changes. ENT: No sore throat. Respiratory: Denies cough Genitourinary: Negative for dysuria. Musculoskeletal: Negative for back pain.  Positive for right rib pain Skin: Negative for  rash.    ____________________________________________   PHYSICAL EXAM:  VITAL SIGNS: ED Triage Vitals  Enc Vitals Group     BP 06/14/18 1011 (!) 150/100     Pulse --      Resp 06/14/18 1011 20     Temp 06/14/18 1011 98 F (36.7 C)     Temp Source 06/14/18 1011 Oral     SpO2 06/14/18 1011 97 %     Weight 06/14/18 1015 210 lb (95.3 kg)     Height 06/14/18 1015 5\' 5"  (1.651 m)     Head Circumference --      Peak Flow --      Pain Score 06/14/18 1015 10     Pain Loc --      Pain Edu? --      Excl. in [place]? --     Constitutional: Alert and oriented. Well appearing and in mildly acute distress due to discomfort Eyes: Conjunctivae are normal.  Head: Atraumatic. Nose: No congestion/rhinnorhea. Mouth/Throat: Mucous membranes are moist.   Neck:  supple no lymphadenopathy noted Cardiovascular: Normal rate, regular rhythm. Heart sounds are normal Respiratory: Normal respiratory effort.  No retractions, lungs c t a, right ribs are extremely tender to palpation Abd: soft nontender bs normal all 4 quad GU: deferred Musculoskeletal: FROM all extremities, warm and well perfused.  The right elbow and shoulder are not tender. Neurologic:  Normal speech and language.  Skin:  Skin is warm, dry and intact. No rash noted. Psychiatric: Mood and affect are normal. Speech and behavior are normal.  ____________________________________________   LABS (all labs ordered are listed, but only abnormal results are displayed)  Labs Reviewed  BASIC METABOLIC PANEL - Abnormal; Notable for the following components:      Result Value   Glucose, Bld 111 (*)    Calcium 8.5 (*)    All other components within normal limits  CBC WITH DIFFERENTIAL/PLATELET   ____________________________________________   ____________________________________________  RADIOLOGY  X-ray of the right ribs shows questionable old versus new rib fractures on the right side.  CT of the chest shows healing old rib  fractures.  ____________________________________________   PROCEDURES  Procedure(s) performed: Saline lock, Dilaudid 1 mg IV  Procedures    ____________________________________________   INITIAL IMPRESSION / ASSESSMENT AND PLAN / ED COURSE  Pertinent labs & imaging results that were available during my care of the patient were reviewed by me and considered in my medical decision making (see chart for details).   Patient is 52 year old female presents emergency department after a fall 2 days ago.  She landed on her right side.  She is having pain to the right ribs.  She denies any other injuries.  On physical exam patient appears to be in mild distress.  She is uncomfortable.  The right ribs are tender to palpation.  The remainder the exam is unremarkable.  X-ray of the right  ribs is questionable for new versus old rib fractures.  CT of the chest shows old healing rib fractures.  None are acute.  Explained the CT and x-ray findings to the patient.  She was given a prescription for tramadol, Sterapred, and baclofen.  She is to follow-up with her regular doctor or return to the emergency department if worsening.  Apply ice to the area.  She was discharged in stable condition.     As part of my medical decision making, I reviewed the following data within the [place] notes reviewed and incorporated, Old chart reviewed, Radiograph reviewed right rib x-ray shows questionable rib fractures, CT the chest shows old healing rib fractures, Notes from prior ED visits and [place] Controlled Substance Database  ____________________________________________   FINAL CLINICAL IMPRESSION(S) / ED DIAGNOSES  Final diagnoses:  Rib contusion, right, initial encounter  Closed fracture of multiple ribs of right side with routine healing, subsequent encounter      NEW MEDICATIONS STARTED DURING THIS VISIT:  Discharge Medication List as of 06/14/2018 12:53 PM    START  taking these medications   Details  cyclobenzaprine (FLEXERIL) 10 MG tablet Take 1 tablet (10 mg total) by mouth 3 (three) times daily as needed., Starting Mon 06/14/2018, Normal    predniSONE (STERAPRED UNI-PAK 21 TAB) 10 MG (21) TBPK tablet Take 6 pills on day one then decrease by 1 pill each day, Normal    traMADol (ULTRAM) 50 MG tablet Take 1 tablet (50 mg total) by mouth every 6 (six) hours as needed., Starting Mon 06/14/2018, Print         Note:  This document was prepared using Dragon voice recognition software and may include unintentional dictation errors.    Versie Starks, PA-C 06/14/18 1418    Lavonia Drafts, MD 06/14/18 1425

## 2018-06-14 NOTE — Discharge Instructions (Addendum)
Follow-up with your regular doctor or the acute care if not better in 5 7 days.  Return emergency department if worsening.  You have old healing fractures but not a new fracture noted in the right ribs.

## 2018-06-15 ENCOUNTER — Other Ambulatory Visit: Payer: Self-pay

## 2018-06-17 ENCOUNTER — Other Ambulatory Visit: Payer: Self-pay | Admitting: Adult Health Nurse Practitioner

## 2018-06-17 MED ORDER — OXYBUTYNIN CHLORIDE ER 10 MG PO TB24: 10.0000 mg | ORAL_TABLET | Freq: Every day | ORAL | 1 refills | Status: DC

## 2018-06-22 ENCOUNTER — Ambulatory Visit: Payer: Self-pay

## 2018-06-24 ENCOUNTER — Ambulatory Visit: Payer: Self-pay | Admitting: Student in an Organized Health Care Education/Training Program

## 2018-06-24 ENCOUNTER — Telehealth: Payer: Self-pay | Admitting: Adult Health Nurse Practitioner

## 2018-06-24 NOTE — Telephone Encounter (Signed)
Pt called to reschedule [facility], rescheduled to 10/10

## 2018-07-08 ENCOUNTER — Ambulatory Visit: Payer: Self-pay

## 2018-07-08 ENCOUNTER — Ambulatory Visit: Payer: Self-pay | Admitting: Gerontology

## 2018-07-08 VITALS — BP 108/80 | HR 80 | Temp 97.0°F | Ht 68.0 in | Wt 214.5 lb

## 2018-07-08 DIAGNOSIS — M79671 Pain in right foot: Secondary | ICD-10-CM

## 2018-07-08 DIAGNOSIS — M79672 Pain in left foot: Secondary | ICD-10-CM

## 2018-07-08 DIAGNOSIS — K732 Chronic active hepatitis, not elsewhere classified: Secondary | ICD-10-CM

## 2018-07-08 DIAGNOSIS — G629 Polyneuropathy, unspecified: Secondary | ICD-10-CM

## 2018-07-08 MED ORDER — CYCLOBENZAPRINE HCL 10 MG PO TABS: 10.0000 mg | ORAL_TABLET | Freq: Three times a day (TID) | ORAL | 0 refills | Status: DC | PRN

## 2018-07-08 MED ORDER — OMEPRAZOLE 20 MG PO CPDR: 20.0000 mg | DELAYED_RELEASE_CAPSULE | Freq: Every day | ORAL | 0 refills | Status: DC

## 2018-07-08 MED ORDER — IBUPROFEN 200 MG PO TABS: 600.0000 mg | ORAL_TABLET | Freq: Three times a day (TID) | ORAL | Status: DC | PRN

## 2018-07-08 MED ORDER — GABAPENTIN 400 MG PO CAPS: 400.0000 mg | ORAL_CAPSULE | Freq: Three times a day (TID) | ORAL | 1 refills | Status: DC

## 2018-07-08 NOTE — Progress Notes (Signed)
Patient: Monique Schaefer Female    DOB: [birth year]   52 y.o.   MRN: [id number] Visit Date: 07/08/2018  Today's Provider: Langston Reusing, NP   Chief Complaint  Patient presents with  . Hepatitis C  . Medication Refill  . Hand Pain    "i have RA" Hx of carpal tunnel   Subjective:    HPI  Patient stated had a positive hep C and GI referral will be ordered.Patient's Gabapentin was increased to  400mg  TID on last visit to the ED and it helps with neuropathy, Also taking Vit B12 which helps. Has hand pain with trigger finger that she anticipates needing surgery but not at this time. States she would like refill on Flexeril for help with pain at bedtime.   Allergies  Allergen Reactions  . Paxil [Paroxetine Hcl] Swelling  . Mango Flavor Hives  . Morphine And Related Itching  . Oxycodone-Acetaminophen Itching   Previous Medications   CYCLOBENZAPRINE (FLEXERIL) 10 MG TABLET    Take 1 tablet (10 mg total) by mouth 3 (three) times daily as needed.   GABAPENTIN (NEURONTIN) 100 MG CAPSULE    Take 1 capsule (100 mg total) by mouth 3 (three) times daily. Increase your base dose every 2 days as discussed.   GABAPENTIN (NEURONTIN) 300 MG CAPSULE    Take 1 capsule (300 mg total) by mouth 3 (three) times daily.   IBUPROFEN (ADVIL,MOTRIN) 200 MG TABLET    Take 3-4 tablets (600-800 mg total) by mouth every 8 (eight) hours as needed.   OXYBUTYNIN (DITROPAN-XL) 10 MG 24 HR TABLET    Take 1 tablet (10 mg total) by mouth at bedtime.   PREDNISONE (STERAPRED UNI-PAK 21 TAB) 10 MG (21) TBPK TABLET    Take 6 pills on day one then decrease by 1 pill each day   RANITIDINE (ZANTAC) 150 MG TABLET    Take 1 tablet (150 mg total) by mouth 2 (two) times daily.   TRAMADOL (ULTRAM) 50 MG TABLET    Take 1 tablet (50 mg total) by mouth every 6 (six) hours as needed.    Review of Systems  Constitutional: Negative.   Respiratory: Negative.   Cardiovascular: Negative.   Musculoskeletal: Positive for arthralgias and  back pain.  Neurological: Negative.     Social History   Tobacco Use  . Smoking status: Current Every Day Smoker    Packs/day: 0.50    Years: 15.00    Pack years: 7.50  . Smokeless tobacco: Never Used  . Tobacco comment: patient has materials already  Substance Use Topics  . Alcohol use: Not Currently    Frequency: Never   Objective:   BP 108/80   Pulse 80   Temp (!) 97 F (36.1 C)   Ht 5\' 8"  (1.727 m)   Wt 214 lb 8 oz (97.3 kg)   LMP  (LMP Unknown)   BMI 32.61 kg/m   Physical Exam  Constitutional: She is oriented to person, place, and time. She appears well-developed and well-nourished.  HENT:  Head: Normocephalic and atraumatic.  Cardiovascular: Normal rate, regular rhythm, normal heart sounds and intact distal pulses.  Pulmonary/Chest: Effort normal and breath sounds normal.  Abdominal: Soft. Bowel sounds are normal.  Neurological: She is alert and oriented to person, place, and time. No cranial nerve deficit.  Skin: Skin is warm and dry.  Psychiatric: She has a normal mood and affect.        Assessment & Plan:  Gi referral for Hep C Refill medications- Changed Zantac to Prilosec 20 mg daily samples given #28   Langston Reusing, NP   [facility]

## 2018-07-27 ENCOUNTER — Encounter

## 2018-07-27 ENCOUNTER — Ambulatory Visit: Payer: Self-pay | Admitting: Gastroenterology

## 2018-08-05 ENCOUNTER — Other Ambulatory Visit: Payer: Self-pay | Admitting: Gerontology

## 2018-08-05 ENCOUNTER — Other Ambulatory Visit: Payer: Self-pay | Admitting: Internal Medicine

## 2018-08-17 ENCOUNTER — Ambulatory Visit: Payer: Self-pay | Admitting: Diagnostic Neuroimaging

## 2018-08-19 ENCOUNTER — Ambulatory Visit: Payer: Self-pay | Admitting: Gerontology

## 2018-08-25 ENCOUNTER — Ambulatory Visit: Payer: Self-pay | Admitting: Gerontology

## 2018-09-07 ENCOUNTER — Other Ambulatory Visit: Payer: Self-pay

## 2018-09-07 ENCOUNTER — Ambulatory Visit: Payer: Self-pay | Admitting: Gerontology

## 2018-09-07 ENCOUNTER — Encounter: Payer: Self-pay | Admitting: Gerontology

## 2018-09-07 VITALS — BP 122/81 | HR 82 | Ht 65.0 in | Wt 213.1 lb

## 2018-09-07 DIAGNOSIS — F4325 Adjustment disorder with mixed disturbance of emotions and conduct: Secondary | ICD-10-CM

## 2018-09-07 DIAGNOSIS — R32 Unspecified urinary incontinence: Secondary | ICD-10-CM

## 2018-09-07 DIAGNOSIS — Z720 Tobacco use: Secondary | ICD-10-CM

## 2018-09-07 DIAGNOSIS — M549 Dorsalgia, unspecified: Secondary | ICD-10-CM

## 2018-09-07 DIAGNOSIS — K732 Chronic active hepatitis, not elsewhere classified: Secondary | ICD-10-CM

## 2018-09-07 DIAGNOSIS — M79672 Pain in left foot: Secondary | ICD-10-CM

## 2018-09-07 DIAGNOSIS — M79671 Pain in right foot: Secondary | ICD-10-CM

## 2018-09-07 DIAGNOSIS — G629 Polyneuropathy, unspecified: Secondary | ICD-10-CM

## 2018-09-07 DIAGNOSIS — Z8719 Personal history of other diseases of the digestive system: Secondary | ICD-10-CM

## 2018-09-07 MED ORDER — CYCLOBENZAPRINE HCL 10 MG PO TABS: 10.0000 mg | ORAL_TABLET | Freq: Three times a day (TID) | ORAL | 1 refills | Status: DC | PRN

## 2018-09-07 MED ORDER — GABAPENTIN 400 MG PO CAPS: 400.0000 mg | ORAL_CAPSULE | Freq: Three times a day (TID) | ORAL | 2 refills | Status: DC

## 2018-09-07 MED ORDER — IBUPROFEN 200 MG PO TABS: 600.0000 mg | ORAL_TABLET | Freq: Three times a day (TID) | ORAL | Status: DC | PRN

## 2018-09-07 MED ORDER — OMEPRAZOLE 20 MG PO CPDR: 20.0000 mg | DELAYED_RELEASE_CAPSULE | Freq: Every day | ORAL | 2 refills | Status: DC

## 2018-09-07 MED ORDER — OXYBUTYNIN CHLORIDE ER 10 MG PO TB24: 10.0000 mg | ORAL_TABLET | Freq: Every day | ORAL | 1 refills | Status: DC

## 2018-09-07 NOTE — Progress Notes (Signed)
Patient: Monique Schaefer Female    DOB: [birth year]   52 y.o.   MRN: [id number] Visit Date: 09/07/2018  Today's Provider: Langston Reusing, NP   No chief complaint on file.  Subjective:    HPI   Monique Schaefer  52 y/o presents for follow up on multiple issues and medication refill. She reports taking 800 mg Ibuprofen BID which relieves her knee pain. She stated that she has not been to the Podiatrist  for neuropathy and pain to her feet secondary to lack of fund, but she continues to take 400 mg gabapentin TID with minimal relief. She continues on 10 mg Flexeril TID  for back spasms with relief.  She reports that she experiences intermittent cold and pin sensation between her shoulder blades that radiates to her left arm which occurs daily. 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 She denies using illicit drug, but continues to smoke 1/2 pack a day and doesn't want to quit.   Patient started crying, saying that she hurts all over, and needs to be on disability, request to see Monique Schaefer. She reports having a history of Bipolar disorder and receives care at [facility]. She denies suicidal ideation.  She reports that when she lying down in bed, it's difficult for her to swallow, but she thinks it's anxiety, but she denies any difficulty swallowing while she is eating.   She reports taking Omeprazole 20 mg daily with relief   Chronic Hep C: She reports that she has an appointment with GI on 09/14/18  Urinary incontinence : She reports that her urinary incontinence symptoms has improved with 10 mg Ditropan -XL.      Allergies  Allergen Reactions  . Paxil [Paroxetine Hcl] Swelling  . Mango Flavor Hives  . Morphine And Related Itching  . Trazodone And Nefazodone   . Oxycodone-Acetaminophen Itching   Previous Medications   CYCLOBENZAPRINE (FLEXERIL) 10 MG TABLET    Take 1 tablet (10 mg total)  by mouth 3 (three) times daily as needed.   GABAPENTIN (NEURONTIN) 400 MG CAPSULE    Take 1 capsule (400 mg total) by mouth 3 (three) times daily.   IBUPROFEN (ADVIL,MOTRIN) 200 MG TABLET    Take 3-4 tablets (600-800 mg total) by mouth every 8 (eight) hours as needed.   OMEPRAZOLE (PRILOSEC) 20 MG CAPSULE    TAKE ONE CAPSULE BY MOUTH EVERY DAY   OXYBUTYNIN (DITROPAN-XL) 10 MG 24 HR TABLET    Take 1 tablet (10 mg total) by mouth at bedtime.   QUETIAPINE (SEROQUEL) 100 MG TABLET    Take 100 mg by mouth at bedtime.   SERTRALINE (ZOLOFT) 50 MG TABLET    Take 50 mg by mouth daily.    Review of Systems  Constitutional: Negative.   HENT: Negative.   Eyes: Negative.   Respiratory: Negative.   Cardiovascular: Negative.   Gastrointestinal: Negative.   Endocrine: Negative.   Genitourinary: Negative.   Musculoskeletal: Positive for back pain (pin and cold sensation to upper back), gait problem (abnormal) and joint swelling (knees).  Skin: Negative.   Neurological: Positive for numbness (feet).  Psychiatric/Behavioral: The patient is nervous/anxious (crying inability to be on disability).     Social History   Tobacco Use  . Smoking status: Current Every Day Smoker    Packs/day: 0.50    Years: 15.00    Pack years: 7.50  .  Smokeless tobacco: Never Used  . Tobacco comment: patient has materials already  Substance Use Topics  . Alcohol use: Not Currently    Frequency: Never   Objective:   LMP  (LMP Unknown)   Physical Exam  Constitutional: She is oriented to person, place, and time. She appears well-developed.  HENT:  Head: Normocephalic and atraumatic.  Eyes: Pupils are equal, round, and reactive to light.  Neck: Normal range of motion.  Cardiovascular: Normal rate and regular rhythm.  Pulmonary/Chest: Effort normal and breath sounds normal.  Abdominal: Soft. Bowel sounds are normal.  Musculoskeletal: She exhibits tenderness (on palpation to upper back).  Neurological: She is alert  and oriented to person, place, and time.  Skin: Skin is warm and dry.  Psychiatric: She has a normal mood and affect.        Assessment & Plan:     1. Bilateral foot pain - She will follow up with podiatry, filled out charity care - ibuprofen (ADVIL,MOTRIN) 200 MG tablet; Take 3-4 tablets (600-800 mg total) by mouth every 8 (eight) hours as needed.  Dispense: 30 tablet  2. Chronic active hepatitis ([place]) - She will follow up with GI on 09/14/18  3. Neuropathy - She will follow up with podiatry when charity care is approved - cyclobenzaprine (FLEXERIL) 10 MG tablet; Take 1 tablet (10 mg total) by mouth 3 (three) times daily as needed.  Dispense: 60 tablet; Refill: 1 - gabapentin (NEURONTIN) 400 MG capsule; Take 1 capsule (400 mg total) by mouth 3 (three) times daily.  Dispense: 90 capsule; Refill: 2  4. Urinary incontinence, unspecified type She will continue - oxybutynin (DITROPAN-XL) 10 MG 24 hr tablet; Take 1 tablet (10 mg total) by mouth at bedtime.  Dispense: 30 tablet; Refill: 1  5. Upper back pain - Back exercise provided - Physical therapy referral - Continue to take 10 mg cyclobenzaprine 10 mg TID for spasms.  6. Tobacco abuse - She was strongly advised on smoking cessation  7. H/O gastroesophageal reflux (GERD) - She will  omeprazole (PRILOSEC) 20 MG capsule; Take 1 capsule (20 mg total) by mouth daily.  Dispense: 30 capsule; Refill: 2  8. Adjustment disorder with mixed disturbance of emotions and conduct - She will continue to receive care at [facility], and will consult with Monique Schaefer with regards to disability - Follow up in 2 months and notify provider if symptom worsens.       Langston Reusing, NP   [facility]

## 2018-09-07 NOTE — Patient Instructions (Signed)

## 2018-09-14 ENCOUNTER — Ambulatory Visit: Payer: Self-pay | Admitting: Gastroenterology

## 2018-09-14 ENCOUNTER — Ambulatory Visit: Payer: Self-pay | Admitting: Licensed Clinical Social Worker

## 2018-09-14 DIAGNOSIS — Z598 Other problems related to housing and economic circumstances: Secondary | ICD-10-CM

## 2018-09-14 DIAGNOSIS — Z599 Problem related to housing and economic circumstances, unspecified: Secondary | ICD-10-CM

## 2018-09-14 NOTE — BH Specialist Note (Signed)
Financial counseling to assist patient with process of applying for charity care.

## 2018-09-16 ENCOUNTER — Other Ambulatory Visit: Payer: Self-pay

## 2018-09-16 ENCOUNTER — Emergency Department
Admission: EM | Admit: 2018-09-16 | Discharge: 2018-09-16 | Disposition: A | Discharge status: Home / Self Care | Payer: Self-pay | Attending: Emergency Medicine | Admitting: Emergency Medicine

## 2018-09-16 ENCOUNTER — Encounter: Payer: Self-pay | Admitting: Emergency Medicine

## 2018-09-16 DIAGNOSIS — M5441 Lumbago with sciatica, right side: Secondary | ICD-10-CM | POA: Insufficient documentation

## 2018-09-16 DIAGNOSIS — F209 Schizophrenia, unspecified: Secondary | ICD-10-CM | POA: Insufficient documentation

## 2018-09-16 DIAGNOSIS — B1920 Unspecified viral hepatitis C without hepatic coma: Secondary | ICD-10-CM | POA: Insufficient documentation

## 2018-09-16 DIAGNOSIS — F17210 Nicotine dependence, cigarettes, uncomplicated: Secondary | ICD-10-CM | POA: Insufficient documentation

## 2018-09-16 DIAGNOSIS — Z79899 Other long term (current) drug therapy: Secondary | ICD-10-CM | POA: Insufficient documentation

## 2018-09-16 DIAGNOSIS — M5442 Lumbago with sciatica, left side: Secondary | ICD-10-CM | POA: Insufficient documentation

## 2018-09-16 LAB — URINALYSIS, COMPLETE (UACMP) WITH MICROSCOPIC
Bilirubin Urine: NEGATIVE
Glucose, UA: NEGATIVE mg/dL
Hgb urine dipstick: NEGATIVE
Ketones, ur: NEGATIVE mg/dL
Leukocytes, UA: NEGATIVE
Nitrite: NEGATIVE
Protein, ur: NEGATIVE mg/dL
Specific Gravity, Urine: 1.003 — ABNORMAL LOW (ref 1.005–1.030)
pH: 6 (ref 5.0–8.0)

## 2018-09-16 MED ORDER — MELOXICAM 15 MG PO TABS: 15.0000 mg | ORAL_TABLET | Freq: Every day | ORAL | 0 refills | Status: AC

## 2018-09-16 MED ORDER — CYCLOBENZAPRINE HCL 5 MG PO TABS: ORAL_TABLET | ORAL | 0 refills | Status: DC

## 2018-09-16 MED ORDER — PREDNISONE 10 MG PO TABS: ORAL_TABLET | ORAL | 0 refills | Status: DC

## 2018-09-16 MED ORDER — PREDNISONE 20 MG PO TABS
60.0000 mg | ORAL_TABLET | Freq: Once | ORAL | Status: AC
  Administered 2018-09-16: 60 mg via ORAL
  Filled 2018-09-16: qty 3

## 2018-09-16 MED ORDER — KETOROLAC TROMETHAMINE 30 MG/ML IJ SOLN
30.0000 mg | Freq: Once | INTRAMUSCULAR | Status: AC
  Administered 2018-09-16: 30 mg via INTRAMUSCULAR
  Filled 2018-09-16: qty 1

## 2018-09-16 NOTE — ED Triage Notes (Signed)
Pt arrives with complaints of a "sciatica flare up." pt states she normally gets cortisol shots but due to financial problems she has not been able to get them.

## 2018-09-16 NOTE — ED Notes (Signed)
See triage note  Presents with lower back pain which is moving into both legs  States right is worse than left  Sates she is currently taking gabapentin 400 mg tid and flexeril   States meds not working   Ambulates slowly d/t pain

## 2018-09-16 NOTE — ED Provider Notes (Signed)
[facility] Emergency Department Provider Note  ____________________________________________  Time seen: Approximately 5:09 PM  I have reviewed the triage vital signs and the nursing notes.   HISTORY  Chief Complaint Back Pain    HPI Monique Schaefer is a 52 y.o. female that presents emergency department for evaluation of low back pain that radiates into bilateral hips for several days.  Patient states that she had a couple of episodes yesterday and the day before where her left leg felt numb.  She has not had any of this feeling today.  Patient also has pain to the tops of her both of her feet and had her gabapentin increased at her last appointment for this but pain continues.  No bowel or bladder dysfunction or saddle anesthesias.  No trauma.  She just got approved for charity care and [facility]  No IV drug use.  No fever, chills, nausea, vomiting, abdominal pain.  Past Medical History:  Diagnosis Date  . ADD (attention deficit disorder)   . Anxiety   . Depression   . Fibromyalgia   . GERD (gastroesophageal reflux disease)   . Neuropathy   . Schizophrenia ([place])   . Scoliosis     Patient Active Problem List   Diagnosis Date Noted  . Cervical lymphadenopathy 06/08/2018  . Chronic active hepatitis ([place]) 06/08/2018  . Bilateral foot pain 05/04/2018  . Neuropathy 05/04/2018  . Elevated TSH 05/04/2018  . Exposure to hepatitis C 05/04/2018  . Back pain 03/04/2018  . Health care maintenance 03/04/2018  . 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 Cocaine abuse ([place]) 02/21/2018  . Cannabis abuse 02/21/2018  . Homelessness 02/21/2018    Past Surgical History:  Procedure Laterality Date  . BILATERAL CARPAL TUNNEL RELEASE Bilateral   . CHOLECYSTECTOMY    . FEMUR SURGERY Left   . JOINT REPLACEMENT    . REPLACEMENT TOTAL KNEE BILATERAL Bilateral     Prior to Admission  medications   Medication Sig Start Date End Date Taking? Authorizing Provider  buprenorphine-naloxone (SUBOXONE) 8-2 mg SUBL SL tablet Place 1 tablet under the tongue daily.    [provider]  cyclobenzaprine (FLEXERIL) 5 MG tablet Take 1-2 tablets 3 times daily as needed 09/16/18   Laban Emperor, PA-C  gabapentin (NEURONTIN) 400 MG capsule Take 1 capsule (400 mg total) by mouth 3 (three) times daily. 09/07/18   Iloabachie, Chioma E, NP  ibuprofen (ADVIL,MOTRIN) 200 MG tablet Take 3-4 tablets (600-800 mg total) by mouth every 8 (eight) hours as needed. 09/07/18   Iloabachie, Chioma E, NP  meloxicam (MOBIC) 15 MG tablet Take 1 tablet (15 mg total) by mouth daily for 10 days. 09/16/18 09/26/18  Laban Emperor, PA-C  omeprazole (PRILOSEC) 20 MG capsule Take 1 capsule (20 mg total) by mouth daily. 09/07/18   Iloabachie, Chioma E, NP  oxybutynin (DITROPAN-XL) 10 MG 24 hr tablet Take 1 tablet (10 mg total) by mouth at bedtime. 09/07/18   Iloabachie, Chioma E, NP  predniSONE (DELTASONE) 10 MG tablet Take 6 tablets on day 1, take 5 tablets on day 2, take 4 tablets on day 3, take 3 tablets on day 4, take 2 tablets on day 5, take 1 tablet on day 6 09/16/18   Laban Emperor, PA-C  QUEtiapine (SEROQUEL) 100 MG tablet Take 100 mg by mouth at bedtime.    [provider]  sertraline (ZOLOFT) 50 MG tablet Take 50 mg by  mouth daily.    [provider]    Allergies Paxil [paroxetine hcl]; Mango flavor; Morphine and related; Trazodone and nefazodone; and Oxycodone-acetaminophen  No family history on file.  Social History Social History   Tobacco Use  . Smoking status: Current Every Day Smoker    Packs/day: 0.50    Years: 15.00    Pack years: 7.50  . Smokeless tobacco: Never Used  . Tobacco comment: patient has materials already  Substance Use Topics  . Alcohol use: Not Currently    Frequency: Never  . Drug use: Not Currently    Types: Marijuana     Review of Systems   Constitutional: No fever/chills Cardiovascular: No chest pain. Respiratory: No SOB. Gastrointestinal: No abdominal pain.  No nausea, no vomiting.  Musculoskeletal: Positive for back pain.  Skin: Negative for rash, abrasions, lacerations, ecchymosis. Neurological: Negative for headaches, tingling   ____________________________________________   PHYSICAL EXAM:  VITAL SIGNS: ED Triage Vitals [09/16/18 1632]  Enc Vitals Group     BP 120/80     Pulse Rate 90     Resp 18     Temp 98.2 F (36.8 C)     Temp Source Oral     SpO2 97 %     Weight 226 lb (102.5 kg)     Height 5\' 5"  (1.651 m)     Head Circumference      Peak Flow      Pain Score 10     Pain Loc      Pain Edu?      Excl. in [place]?      Constitutional: Alert and oriented. Well appearing and in no acute distress. Eyes: Conjunctivae are normal. PERRL. EOMI. Head: Atraumatic. ENT:      Ears:      Nose: No congestion/rhinnorhea.      Mouth/Throat: Mucous membranes are moist.  Neck: No stridor.  Cardiovascular: Normal rate, regular rhythm.  Good peripheral circulation. Respiratory: Normal respiratory effort without tachypnea or retractions. Lungs CTAB. Good air entry to the bases with no decreased or absent breath sounds. Gastrointestinal: Bowel sounds 4 quadrants. Soft and nontender to palpation. No guarding or rigidity. No palpable masses. No distention.  Musculoskeletal: Full range of motion to all extremities. No gross deformities appreciated.  Tenderness to palpation to bilateral lumbar paraspinal muscles and bilateral hips.  Full range of motion of bilateral hips.  Strength sensation equal in lower extremities bilaterally.  Antalgic gait. Neurologic:  Normal speech and language. No gross focal neurologic deficits are appreciated.  Skin:  Skin is warm, dry and intact. No rash noted. Psychiatric: Mood and affect are normal. Speech and behavior are normal. Patient exhibits appropriate insight and  judgement.   ____________________________________________   LABS (all labs ordered are listed, but only abnormal results are displayed)  Labs Reviewed  URINALYSIS, COMPLETE (UACMP) WITH MICROSCOPIC - Abnormal; Notable for the following components:      Result Value   Color, Urine STRAW (*)    APPearance CLEAR (*)    Specific Gravity, Urine 1.003 (*)    Bacteria, UA RARE (*)    All other components within normal limits   ____________________________________________  EKG   ____________________________________________  RADIOLOGY   No results found.  ____________________________________________    PROCEDURES  Procedure(s) performed:    Procedures    Medications  ketorolac (TORADOL) 30 MG/ML injection 30 mg (30 mg Intramuscular Given 09/16/18 1733)  predniSONE (DELTASONE) tablet 60 mg (60 mg Oral Given 09/16/18 1853)  ____________________________________________   INITIAL IMPRESSION / ASSESSMENT AND PLAN / ED COURSE  Pertinent labs & imaging results that were available during my care of the patient were reviewed by me and considered in my medical decision making (see chart for details).  Review of the [place] CSRS was performed in accordance of the [place] prior to dispensing any controlled drugs.     Patient presented to emergency department for evaluation of chronic low back pain.  Vital signs and exam are reassuring.  IM Toradol was given with some relief.  Patient will be discharged home with prescriptions for prednisone, Flexeril, Mobic. Patient is to follow up with primary care as directed. Patient is given ED precautions to return to the ED for any worsening or new symptoms.     ____________________________________________  FINAL CLINICAL IMPRESSION(S) / ED DIAGNOSES  Final diagnoses:  Acute bilateral low back pain with bilateral sciatica      NEW MEDICATIONS STARTED DURING THIS VISIT:  ED Discharge Orders         Ordered    predniSONE  (DELTASONE) 10 MG tablet     09/16/18 1844    cyclobenzaprine (FLEXERIL) 5 MG tablet     09/16/18 1844    meloxicam (MOBIC) 15 MG tablet  Daily     09/16/18 1844              This chart was dictated using voice recognition software/Dragon. Despite best efforts to proofread, errors can occur which can change the meaning. Any change was purely unintentional.    Laban Emperor, PA-C 09/16/18 1857    Arta Silence, MD 09/16/18 1947

## 2018-10-05 ENCOUNTER — Ambulatory Visit: Payer: Self-pay

## 2018-10-19 ENCOUNTER — Ambulatory Visit: Payer: Self-pay | Admitting: Gastroenterology

## 2018-11-01 ENCOUNTER — Ambulatory Visit: Payer: Self-pay | Admitting: Podiatry

## 2018-11-02 ENCOUNTER — Ambulatory Visit: Payer: Self-pay | Admitting: Specialist

## 2018-11-09 ENCOUNTER — Ambulatory Visit: Payer: Self-pay | Admitting: Gerontology

## 2018-11-15 ENCOUNTER — Telehealth: Payer: Self-pay | Admitting: Gastroenterology

## 2018-11-15 ENCOUNTER — Ambulatory Visit: Payer: Self-pay | Admitting: Gastroenterology

## 2018-11-15 ENCOUNTER — Encounter: Payer: Self-pay | Admitting: Gastroenterology

## 2018-11-15 DIAGNOSIS — Z205 Contact with and (suspected) exposure to viral hepatitis: Secondary | ICD-10-CM

## 2018-11-15 NOTE — Telephone Encounter (Signed)
Pt has r/s her apt with [facility] since 07/27/2018 and No showed several appointments due to this do not r/s with  Dr. Vicente Males per Dr. Vicente Males

## 2018-11-18 ENCOUNTER — Emergency Department: Payer: Medicaid Other

## 2018-11-18 ENCOUNTER — Encounter: Payer: Self-pay | Admitting: *Deleted

## 2018-11-18 ENCOUNTER — Other Ambulatory Visit: Payer: Self-pay | Admitting: Gerontology

## 2018-11-18 ENCOUNTER — Ambulatory Visit: Payer: Self-pay | Admitting: Gerontology

## 2018-11-18 ENCOUNTER — Emergency Department
Admission: EM | Admit: 2018-11-18 | Discharge: 2018-11-18 | Discharge status: Against Medical Advice | Payer: Medicaid Other | Attending: Emergency Medicine | Admitting: Emergency Medicine

## 2018-11-18 ENCOUNTER — Other Ambulatory Visit: Payer: Self-pay

## 2018-11-18 DIAGNOSIS — G8929 Other chronic pain: Secondary | ICD-10-CM

## 2018-11-18 DIAGNOSIS — R059 Cough, unspecified: Secondary | ICD-10-CM

## 2018-11-18 DIAGNOSIS — R1310 Dysphagia, unspecified: Secondary | ICD-10-CM

## 2018-11-18 DIAGNOSIS — Z5321 Procedure and treatment not carried out due to patient leaving prior to being seen by health care provider: Secondary | ICD-10-CM | POA: Diagnosis not present

## 2018-11-18 DIAGNOSIS — M5442 Lumbago with sciatica, left side: Principal | ICD-10-CM

## 2018-11-18 DIAGNOSIS — M5441 Lumbago with sciatica, right side: Principal | ICD-10-CM

## 2018-11-18 DIAGNOSIS — Z8719 Personal history of other diseases of the digestive system: Secondary | ICD-10-CM

## 2018-11-18 DIAGNOSIS — R109 Unspecified abdominal pain: Secondary | ICD-10-CM | POA: Diagnosis not present

## 2018-11-18 DIAGNOSIS — R05 Cough: Secondary | ICD-10-CM

## 2018-11-18 LAB — COMPREHENSIVE METABOLIC PANEL WITH GFR
ALT: 20 U/L (ref 0–44)
AST: 31 U/L (ref 15–41)
Albumin: 3.4 g/dL — ABNORMAL LOW (ref 3.5–5.0)
Alkaline Phosphatase: 87 U/L (ref 38–126)
Anion gap: 6 (ref 5–15)
BUN: 11 mg/dL (ref 6–20)
CO2: 24 mmol/L (ref 22–32)
Calcium: 8.4 mg/dL — ABNORMAL LOW (ref 8.9–10.3)
Chloride: 108 mmol/L (ref 98–111)
Creatinine, Ser: 0.54 mg/dL (ref 0.44–1.00)
GFR calc Af Amer: >60 mL/min
GFR calc non Af Amer: >60 mL/min
Glucose, Bld: 97 mg/dL (ref 70–99)
Potassium: 3.6 mmol/L (ref 3.5–5.1)
Sodium: 138 mmol/L (ref 135–145)
Total Bilirubin: 0.4 mg/dL (ref 0.3–1.2)
Total Protein: 6.7 g/dL (ref 6.5–8.1)

## 2018-11-18 LAB — CBC
HCT: 40.5 % (ref 36.0–46.0)
Hemoglobin: 12.9 g/dL (ref 12.0–15.0)
MCH: 29.3 pg (ref 26.0–34.0)
MCHC: 31.9 g/dL (ref 30.0–36.0)
MCV: 91.8 fL (ref 80.0–100.0)
Platelets: 149 K/uL — ABNORMAL LOW (ref 150–400)
RBC: 4.41 MIL/uL (ref 3.87–5.11)
RDW: 12.7 % (ref 11.5–15.5)
WBC: 6.4 K/uL (ref 4.0–10.5)
nRBC: 0 % (ref 0.0–0.2)

## 2018-11-18 LAB — LIPASE, BLOOD: Lipase: 25 U/L (ref 11–51)

## 2018-11-18 MED ORDER — ONDANSETRON 4 MG PO TBDP
4.0000 mg | ORAL_TABLET | Freq: Once | ORAL | Status: AC
  Administered 2018-11-18: 4 mg via ORAL

## 2018-11-18 MED ORDER — ONDANSETRON HCL 4 MG PO TABS: 4.0000 mg | ORAL_TABLET | Freq: Once | ORAL | Status: DC

## 2018-11-18 MED ORDER — ONDANSETRON 4 MG PO TBDP
ORAL_TABLET | ORAL | Status: AC
  Administered 2018-11-18: 4 mg via ORAL
  Filled 2018-11-18: qty 1

## 2018-11-18 NOTE — ED Notes (Signed)
Pt to 1st nurse desk asking again about wait time. Pt updated as to place in line. Explained again that pts must be seen in order of highest acuity and then wait time. Pt verbalized understanding.

## 2018-11-18 NOTE — ED Triage Notes (Signed)
Pt to ED reporting lower abd pain x 4 days that hurts worse when pt is coughing. NO fever. Vomiting started in the lobby today with pt reporting bright red blood in vomit. Pt tearful in triage with increased pain. Pt also reporting diarrhea x 4 days. Decreased appetite.   Cough is reported with green sputum. Pain also wraps around to her left flank, no changes in her urine.

## 2018-11-18 NOTE — ED Notes (Signed)
Pt to 1st nurse desk inquiring about wait time. Pt updated on place in line at this time.

## 2018-11-18 NOTE — ED Notes (Signed)
Pt called for room x1

## 2018-11-18 NOTE — ED Notes (Signed)
Called for room x2 with no response.

## 2018-11-19 LAB — CBC WITH DIFFERENTIAL/PLATELET
BASOS: 0 %
Basophils Absolute: 0 10*3/uL (ref 0.0–0.2)
EOS (ABSOLUTE): 0.2 10*3/uL (ref 0.0–0.4)
EOS: 3 %
HEMATOCRIT: 36.8 % (ref 34.0–46.6)
Hemoglobin: 12.9 g/dL (ref 11.1–15.9)
Immature Grans (Abs): 0 10*3/uL (ref 0.0–0.1)
Immature Granulocytes: 0 %
Lymphocytes Absolute: 1.5 10*3/uL (ref 0.7–3.1)
Lymphs: 26 %
MCH: 30.4 pg (ref 26.6–33.0)
MCHC: 35.1 g/dL (ref 31.5–35.7)
MCV: 87 fL (ref 79–97)
Monocytes Absolute: 0.3 10*3/uL (ref 0.1–0.9)
Monocytes: 6 %
Neutrophils Absolute: 3.8 10*3/uL (ref 1.4–7.0)
Neutrophils: 65 %
Platelets: 189 10*3/uL (ref 150–450)
RBC: 4.25 x10E6/uL (ref 3.77–5.28)
RDW: 12.4 % (ref 11.7–15.4)
WBC: 5.9 10*3/uL (ref 3.4–10.8)

## 2018-11-19 LAB — URINALYSIS, ROUTINE W REFLEX MICROSCOPIC
Bilirubin, UA: NEGATIVE
GLUCOSE, UA: NEGATIVE
Ketones, UA: NEGATIVE
Leukocytes, UA: NEGATIVE
Nitrite, UA: NEGATIVE
Protein, UA: NEGATIVE
RBC, UA: NEGATIVE
Specific Gravity, UA: 1.027 (ref 1.005–1.030)
Urobilinogen, Ur: 1 mg/dL (ref 0.2–1.0)
pH, UA: 6.5 (ref 5.0–7.5)

## 2018-11-20 MED ORDER — BENZONATATE 100 MG PO CAPS: 100.0000 mg | ORAL_CAPSULE | Freq: Three times a day (TID) | ORAL | 0 refills | Status: DC | PRN

## 2018-11-20 NOTE — Progress Notes (Signed)
Acute Office Visit  Subjective:    Patient ID: Monique Schaefer, female    DOB: 06-24-1966, 53 y.o.   MRN: [id number]  No chief complaint on file.   HPI Patient is in today for c/o productive cough that has being going on for 1  week. She states that she coughs up greenish phlegm and has used otc cough medications without relief. She denies fever, chills, sinus congestion, headache, sore throat, shortness of breath, chest pain, and palpitation. She c/o dull none radiating 5/10 pain to left upper abdomen that started 4 days ago. She states that it is likely she pulled some muscles while coughing. She states that it hurts with coughing and changing positions. She denies constipation, admits to moving her bowel daily, denies dysuria.  She reports that she continues to experience difficulty swallowing and that she concentrates to swallowing water or food. She denies pain, choking and coughing when swallowing. She states that it has being going on for 3 months. Otherwise she denies any further concerns.  Past Medical History:  Diagnosis Date  . ADD (attention deficit disorder)   . Anxiety   . Depression   . Fibromyalgia   . GERD (gastroesophageal reflux disease)   . Neuropathy   . Schizophrenia ([place])   . Scoliosis     Past Surgical History:  Procedure Laterality Date  . BILATERAL CARPAL TUNNEL RELEASE Bilateral   . CHOLECYSTECTOMY    . FEMUR SURGERY Left   . JOINT REPLACEMENT    . REPLACEMENT TOTAL KNEE BILATERAL Bilateral     No family history on file.  Social History   Socioeconomic History  . Marital status: Single    Spouse name: Not on file  . Number of children: Not on file  . Years of education: Not on file  . Highest education level: Not on file  Occupational History  . Occupation: [employer]  . Financial resource strain: Somewhat hard  . Food insecurity:    Worry: Sometimes true    Inability: Sometimes true  . Transportation needs:    Medical: Yes   Non-medical: Yes  Tobacco Use  . Smoking status: Current Every Day Smoker    Packs/day: 0.50    Years: 15.00    Pack years: 7.50  . Smokeless tobacco: Never Used  . Tobacco comment: patient has materials already  Substance and Sexual Activity  . Alcohol use: Not Currently    Frequency: Never  . Drug use: Not Currently    Types: Marijuana  . Sexual activity: Not on file  Lifestyle  . Physical activity:    Days per week: 5 days    Minutes per session: Not on file  . Stress: Very much  Relationships  . Social connections:    Talks on phone: More than three times a week    Gets together: Never    Attends religious service: More than 4 times per year    Active member of club or organization: No    Attends meetings of clubs or organizations: Never    Relationship status: Divorced  . Intimate partner violence:    Fear of current or ex partner: No    Emotionally abused: No    Physically abused: No    Forced sexual activity: No  Other Topics Concern  . Not on file  Social History Narrative   Pt has been in [place] for 9 days. Pt is living at homeless shelter. Food is taken care of as of now by  the shelter. She has a bus pass. She has applied for a job.       [name] has been in [place] for quite some time since June of 2019. If she is staying at the shelter then she does not need [facility] services at this time.     Outpatient Medications Prior to Visit  Medication Sig Dispense Refill  . buprenorphine-naloxone (SUBOXONE) 8-2 mg SUBL SL tablet Place 1 tablet under the tongue daily.    . cyclobenzaprine (FLEXERIL) 5 MG tablet Take 1-2 tablets 3 times daily as needed 20 tablet 0  . gabapentin (NEURONTIN) 400 MG capsule Take 1 capsule (400 mg total) by mouth 3 (three) times daily. 90 capsule 2  . ibuprofen (ADVIL,MOTRIN) 200 MG tablet Take 3-4 tablets (600-800 mg total) by mouth every 8 (eight) hours as needed. 30 tablet   . oxybutynin (DITROPAN-XL) 10 MG 24 hr tablet Take 1  tablet (10 mg total) by mouth at bedtime. 30 tablet 1  . predniSONE (DELTASONE) 10 MG tablet Take 6 tablets on day 1, take 5 tablets on day 2, take 4 tablets on day 3, take 3 tablets on day 4, take 2 tablets on day 5, take 1 tablet on day 6 21 tablet 0  . QUEtiapine (SEROQUEL) 100 MG tablet Take 100 mg by mouth at bedtime.    . sertraline (ZOLOFT) 50 MG tablet Take 50 mg by mouth daily.     No facility-administered medications prior to visit.     Allergies  Allergen Reactions  . Paxil [Paroxetine Hcl] Swelling  . Mango Flavor Hives  . Morphine And Related Itching  . Trazodone And Nefazodone   . Oxycodone-Acetaminophen Itching    Review of Systems  Constitutional: Positive for malaise/fatigue. Negative for chills, diaphoresis and fever.  HENT: Negative for ear discharge, ear pain, hearing loss, sinus pain, sore throat and tinnitus.   Eyes: Negative.   Respiratory: Positive for cough and sputum production. Negative for shortness of breath and wheezing.   Cardiovascular: Negative.   Gastrointestinal: Negative for abdominal pain, constipation, diarrhea, heartburn, nausea and vomiting.  Genitourinary: Negative for dysuria and flank pain.  Musculoskeletal: Negative.   Skin: Negative.   Neurological: Negative.   Psychiatric/Behavioral: Negative.        Objective:    Physical Exam  Constitutional: She is oriented to person, place, and time. She appears well-developed and well-nourished.  HENT:  Head: Normocephalic and atraumatic.  Mouth/Throat: Mucous membranes are normal. No uvula swelling. No oropharyngeal exudate or tonsillar abscesses.  Eyes: Pupils are equal, round, and reactive to light. EOM are normal.  Neck: Trachea normal and normal range of motion. No JVD present. No tracheal tenderness present. Carotid bruit is not present. No tracheal deviation present. No thyroid mass and no thyromegaly present.  Cardiovascular: Normal rate and regular rhythm.  Pulmonary/Chest: No  accessory muscle usage. No tachypnea. No respiratory distress. She has wheezes in the right lower field and the left lower field.  Musculoskeletal: Normal range of motion.  Neurological: She is alert and oriented to person, place, and time.  Skin: Skin is warm.  Psychiatric: She has a normal mood and affect. Her behavior is normal. Judgment and thought content normal.    LMP  (LMP Unknown)  Wt Readings from Last 3 Encounters:  11/18/18 215 lb (97.5 kg)  09/16/18 226 lb (102.5 kg)  09/07/18 213 lb 1.6 oz (96.7 kg)    Health Maintenance Due  Topic Date Due  . HIV Screening  06/07/1981  . TETANUS/TDAP  06/07/1985  . PAP SMEAR-Modifier  06/08/1987  . MAMMOGRAM  06/07/2016  . COLONOSCOPY  06/07/2016  . INFLUENZA VACCINE  04/29/2018    There are no preventive care reminders to display for this patient.   Lab Results  Component Value Date   TSH 0.458 05/04/2018   Lab Results  Component Value Date   WBC 5.9 11/18/2018   HGB 12.9 11/18/2018   HCT 36.8 11/18/2018   MCV 87 11/18/2018   PLT 189 11/18/2018   Lab Results  Component Value Date   NA 138 11/18/2018   K 3.6 11/18/2018   CO2 24 11/18/2018   GLUCOSE 97 11/18/2018   BUN 11 11/18/2018   CREATININE 0.54 11/18/2018   BILITOT 0.4 11/18/2018   ALKPHOS 87 11/18/2018   AST 31 11/18/2018   ALT 20 11/18/2018   PROT 6.7 11/18/2018   ALBUMIN 3.4 (L) 11/18/2018   CALCIUM 8.4 (L) 11/18/2018   ANIONGAP 6 11/18/2018   Lab Results  Component Value Date   CHOL 149 03/24/2018   Lab Results  Component Value Date   HDL 71 03/24/2018   Lab Results  Component Value Date   LDLCALC 65 03/24/2018   Lab Results  Component Value Date   TRIG 67 03/24/2018   Lab Results  Component Value Date   CHOLHDL 2.1 03/24/2018   Lab Results  Component Value Date   HGBA1C 5.3 03/24/2018       Assessment & Plan:   Problem List Items Addressed This Visit      Other   Back pain - Primary   Relevant Orders   CBC w/Diff  (Completed)   Urinalysis, Routine w reflex microscopic (Completed)    Other Visit Diagnoses    Cough       Relevant Medications   benzonatate (TESSALON PERLES) 100 MG capsule   Dysphagia, unspecified type           Meds ordered this encounter  Medications  . benzonatate (TESSALON PERLES) 100 MG capsule    Sig: Take 1 capsule (100 mg total) by mouth 3 (three) times daily as needed for cough.    Dispense:  20 capsule    Refill:  0   1. Cough - benzonatate (TESSALON PERLES) 100 MG capsule; Take 1 capsule (100 mg total) by mouth 3 (three) times daily as needed for cough.  Dispense: 20 capsule; Refill: 0  2. Dysphagia, unspecified type - Referral for Barium Swallow study - ENT Referral  3. Cough/ Ddx Pneumonia - Cbc with diff was collected to evaluate for signs of infection and possible chest x ray. - CBC w/Diff; Future - Urinalysis, Routine w reflex microscopic; Future - Take tylenol 650 mg for left upper quadrant abdominal pain, and notify provider for worsening symptoms. - Follow up in 3 weeks or worsening symptoms.   Lexine Jaspers Jerold Coombe, NP

## 2018-11-26 ENCOUNTER — Telehealth: Payer: Self-pay

## 2018-11-26 ENCOUNTER — Other Ambulatory Visit: Payer: Self-pay

## 2018-11-26 DIAGNOSIS — Z0000 Encounter for general adult medical examination without abnormal findings: Secondary | ICD-10-CM

## 2018-11-26 NOTE — Telephone Encounter (Signed)
Called patient to remind her to get new [facility] application and paperwork back to [place] as soon as possible. This expires on 11/28/2018.  Reminded her she has appt with Neurology on 11/29/2018. [place] will expire before then. We need to order Barium Swallow and send ENT referral. Once charity care has been approved, these will be ordered for patient.  Patient understands this is her responsibility to get paperwork updated.

## 2018-11-29 ENCOUNTER — Ambulatory Visit: Payer: Self-pay | Admitting: Diagnostic Neuroimaging

## 2018-11-29 ENCOUNTER — Telehealth: Payer: Self-pay | Admitting: *Deleted

## 2018-11-29 NOTE — Telephone Encounter (Signed)
Patient was no show for new patient appointment today. 

## 2018-11-30 ENCOUNTER — Encounter: Payer: Self-pay | Admitting: Diagnostic Neuroimaging

## 2018-12-06 ENCOUNTER — Other Ambulatory Visit: Payer: Self-pay | Admitting: Gerontology

## 2018-12-06 DIAGNOSIS — G629 Polyneuropathy, unspecified: Secondary | ICD-10-CM

## 2018-12-14 ENCOUNTER — Other Ambulatory Visit: Payer: Self-pay

## 2018-12-14 DIAGNOSIS — R32 Unspecified urinary incontinence: Secondary | ICD-10-CM

## 2018-12-14 MED ORDER — OXYBUTYNIN CHLORIDE ER 10 MG PO TB24: 10.0000 mg | ORAL_TABLET | Freq: Every day | ORAL | 1 refills | Status: DC

## 2019-01-03 ENCOUNTER — Ambulatory Visit: Payer: Self-pay | Admitting: Gastroenterology

## 2019-01-04 ENCOUNTER — Encounter: Payer: Self-pay | Admitting: Gerontology

## 2019-01-04 ENCOUNTER — Ambulatory Visit: Payer: Self-pay | Admitting: Gerontology

## 2019-01-04 ENCOUNTER — Other Ambulatory Visit: Payer: Self-pay

## 2019-01-04 DIAGNOSIS — R1310 Dysphagia, unspecified: Secondary | ICD-10-CM

## 2019-01-04 DIAGNOSIS — G629 Polyneuropathy, unspecified: Secondary | ICD-10-CM

## 2019-01-04 MED ORDER — GABAPENTIN 400 MG PO CAPS: 400.0000 mg | ORAL_CAPSULE | Freq: Three times a day (TID) | ORAL | 2 refills | Status: DC

## 2019-01-04 NOTE — Progress Notes (Signed)
Established Patient Office Visit  Subjective:  Patient ID: Monique Schaefer, female    DOB: Apr 14, 1966  Age: 53 y.o. MRN: [id number]  CC:    HPI Monique Schaefer presents for follow up for dysphagia and worsening neuropathy.  Patient consents to telephone visit and 2 patient identifier was used to identify patient.  Neuropathy to hands and feet: She state that neuropathy to her right hand and feet has worsens, and she's unable to completely close her right hand. She states that she continues to experience intermittent  6/10 dull pain that starts from her left wrist and radiates to left upper arm, which has being going on for more than 6 months. She denies any aggravating factor or injury. She reports that the pain wakes her up at night, and she gets minimal relief from taking gabapentin. She states that she had carpel tunnel surgery to her right hand 3 years ago.She denies fever, chills, chest pain, palpitation and any associated symptoms.  Dysphagia: She states that she continues to experience intermittent regurgitation of food or drink when she swallows, and it has being going on for more than 3 months. She denies cough and choking when swallowing. She's currently in [place] visiting with her children, otherwise denies further concerns.  Past Medical History:  Diagnosis Date  . ADD (attention deficit disorder)   . Anxiety   . Depression   . Fibromyalgia   . GERD (gastroesophageal reflux disease)   . Neuropathy   . Schizophrenia ([place])   . Scoliosis     Past Surgical History:  Procedure Laterality Date  . BILATERAL CARPAL TUNNEL RELEASE Bilateral   . CHOLECYSTECTOMY    . FEMUR SURGERY Left   . JOINT REPLACEMENT    . REPLACEMENT TOTAL KNEE BILATERAL Bilateral     History reviewed. No pertinent family history.  Social History   Socioeconomic History  . Marital status: Single    Spouse name: Not on file  . Number of children: Not on file  . Years of education: Not on file   . Highest education level: Not on file  Occupational History  . Occupation: [employer]  . Financial resource strain: Somewhat hard  . Food insecurity:    Worry: Sometimes true    Inability: Sometimes true  . Transportation needs:    Medical: Yes    Non-medical: Yes  Tobacco Use  . Smoking status: Current Every Day Smoker    Packs/day: 0.50    Years: 15.00    Pack years: 7.50  . Smokeless tobacco: Never Used  . Tobacco comment: patient has materials already  Substance and Sexual Activity  . Alcohol use: Not Currently    Frequency: Never  . Drug use: Not Currently    Types: Marijuana  . Sexual activity: Not on file  Lifestyle  . Physical activity:    Days per week: 5 days    Minutes per session: Not on file  . Stress: Very much  Relationships  . Social connections:    Talks on phone: More than three times a week    Gets together: Never    Attends religious service: More than 4 times per year    Active member of club or organization: No    Attends meetings of clubs or organizations: Never    Relationship status: Divorced  . Intimate partner violence:    Fear of current or ex partner: No    Emotionally abused: No    Physically abused: No  Forced sexual activity: No  Other Topics Concern  . Not on file  Social History Narrative   Pt has been in [place] for 9 days. Pt is living at homeless shelter. Food is taken care of as of now by the shelter. She has a bus pass. She has applied for a job.       [name] has been in [place] for quite some time since June of 2019. If she is staying at the shelter then she does not need [facility] services at this time.     Outpatient Medications Prior to Visit  Medication Sig Dispense Refill  . buprenorphine-naloxone (SUBOXONE) 8-2 mg SUBL SL tablet Place 1 tablet under the tongue daily.    . cyclobenzaprine (FLEXERIL) 5 MG tablet Take 1-2 tablets 3 times daily as needed 20 tablet 0  . ibuprofen (ADVIL,MOTRIN) 200 MG  tablet Take 3-4 tablets (600-800 mg total) by mouth every 8 (eight) hours as needed. 30 tablet   . omeprazole (PRILOSEC) 20 MG capsule TAKE ONE CAPSULE BY MOUTH EVERY DAY 90 capsule 0  . oxybutynin (DITROPAN-XL) 10 MG 24 hr tablet Take 1 tablet (10 mg total) by mouth at bedtime. 30 tablet 1  . QUEtiapine (SEROQUEL) 100 MG tablet Take 100 mg by mouth at bedtime.    . sertraline (ZOLOFT) 50 MG tablet Take 50 mg by mouth daily.    Marland Kitchen gabapentin (NEURONTIN) 400 MG capsule TAKE ONE CAPSULE BY MOUTH 3 TIMES A DAY 90 capsule 0  . benzonatate (TESSALON PERLES) 100 MG capsule Take 1 capsule (100 mg total) by mouth 3 (three) times daily as needed for cough. (Patient not taking: Reported on 01/04/2019) 20 capsule 0  . predniSONE (DELTASONE) 10 MG tablet Take 6 tablets on day 1, take 5 tablets on day 2, take 4 tablets on day 3, take 3 tablets on day 4, take 2 tablets on day 5, take 1 tablet on day 6 (Patient not taking: Reported on 01/04/2019) 21 tablet 0   No facility-administered medications prior to visit.     Allergies  Allergen Reactions  . Paxil [Paroxetine Hcl] Swelling  . Mango Flavor Hives  . Morphine And Related Itching  . Trazodone And Nefazodone   . Oxycodone-Acetaminophen Itching    ROS Review of Systems  Constitutional: Negative.  Negative for fatigue and fever.  HENT: Positive for trouble swallowing. Negative for drooling and sore throat.   Respiratory: Negative.   Cardiovascular: Negative.   Gastrointestinal: Negative.   Genitourinary: Negative.   Musculoskeletal: Back pain: chronic back pain.  Skin: Negative.   Neurological: Positive for numbness (to right hands, feet).  Psychiatric/Behavioral: Negative.       Objective:    Physical Exam   No vital sign or PE was done due to telephone visit.  LMP  (LMP Unknown)  Wt Readings from Last 3 Encounters:  11/18/18 215 lb (97.5 kg)  09/16/18 226 lb (102.5 kg)  09/07/18 213 lb 1.6 oz (96.7 kg)     Health Maintenance Due   Topic Date Due  . HIV Screening  06/07/1981  . TETANUS/TDAP  06/07/1985  . PAP SMEAR-Modifier  06/08/1987  . MAMMOGRAM  06/07/2016  . COLONOSCOPY  06/07/2016    There are no preventive care reminders to display for this patient.  Lab Results  Component Value Date   TSH 0.458 05/04/2018   Lab Results  Component Value Date   WBC 5.9 11/18/2018   HGB 12.9 11/18/2018   HCT 36.8 11/18/2018  MCV 87 11/18/2018   PLT 189 11/18/2018   Lab Results  Component Value Date   NA 138 11/18/2018   K 3.6 11/18/2018   CO2 24 11/18/2018   GLUCOSE 97 11/18/2018   BUN 11 11/18/2018   CREATININE 0.54 11/18/2018   BILITOT 0.4 11/18/2018   ALKPHOS 87 11/18/2018   AST 31 11/18/2018   ALT 20 11/18/2018   PROT 6.7 11/18/2018   ALBUMIN 3.4 (L) 11/18/2018   CALCIUM 8.4 (L) 11/18/2018   ANIONGAP 6 11/18/2018   Lab Results  Component Value Date   CHOL 149 03/24/2018   Lab Results  Component Value Date   HDL 71 03/24/2018   Lab Results  Component Value Date   LDLCALC 65 03/24/2018   Lab Results  Component Value Date   TRIG 67 03/24/2018   Lab Results  Component Value Date   CHOLHDL 2.1 03/24/2018   Lab Results  Component Value Date   HGBA1C 5.3 03/24/2018      Assessment & Plan:   1. Neuropathy - She was encouraged to complete charity care application for Neurology referral for evaluation of worsening neuropathy.  - gabapentin (NEURONTIN) 400 MG capsule; Take 1 capsule (400 mg total) by mouth 3 (three) times daily.  Dispense: 90 capsule; Refill: 2 - Ambulatory referral to Neurology  2. Dysphagia, unspecified type -She was advised to continue on soft diet, clear throat after swallowing. - Was encouraged to complete charity care application for Barium swallow study and ENT referral. - Ambulatory referral to ENT   Follow-up: Return in about 3 weeks (around 01/25/2019), or if symptoms worsen or fail to improve.    Taeshaun Rames Jerold Coombe, NP

## 2019-01-12 ENCOUNTER — Other Ambulatory Visit: Payer: Self-pay | Admitting: Gerontology

## 2019-01-12 ENCOUNTER — Telehealth: Payer: Self-pay

## 2019-01-12 DIAGNOSIS — G629 Polyneuropathy, unspecified: Secondary | ICD-10-CM

## 2019-01-12 NOTE — Telephone Encounter (Signed)
Patient called requesting refill of Flexeril. I spoke with Benjamine Mola. We will not be refilling flexeril at this time. Notified patient.

## 2019-01-24 ENCOUNTER — Ambulatory Visit (INDEPENDENT_AMBULATORY_CARE_PROVIDER_SITE_OTHER): Payer: Self-pay | Admitting: Gastroenterology

## 2019-01-24 DIAGNOSIS — B1920 Unspecified viral hepatitis C without hepatic coma: Secondary | ICD-10-CM

## 2019-01-24 DIAGNOSIS — R825 Elevated urine levels of drugs, medicaments and biological substances: Secondary | ICD-10-CM

## 2019-01-24 DIAGNOSIS — F1410 Cocaine abuse, uncomplicated: Secondary | ICD-10-CM

## 2019-01-24 NOTE — Progress Notes (Signed)
Monique Schaefer  [address]  [place], [place] [address]  Main: [phone]  Fax: [phone]   Gastroenterology Consultation  Referring Provider:     Katheren Shams Primary Care Physician:  Katheren Shams Reason for Consultation:     Hep C         HPI:   Virtual Visit via video  Note  I connected with patient on 01/24/19 at  9:00 AM EDT by video  and verified that I am speaking with the correct person using two identifiers.   I discussed the limitations, risks, security and privacy concerns of performing an evaluation and management service by video and the availability of in person appointments. I also discussed with the patient that there may be a patient responsible charge related to this service. The patient expressed understanding and agreed to proceed.  Location of the patient: Home Location of provider: Home Participating persons: Patient and provider only   History of Present Illness: Chief Complaint  Patient presents with  . Hepatitis C     Monique Schaefer is a 53 y.o. y/o female referred for consultation & management  by Dr. Norville Haggard, Jefm Bryant.    She has been referred for hepatitis C. GT 1a . VL positive in 2019 . Urine drug screen positive for cocaine, benzo, THC, amphetamines in 01/2018 . 10/2018 : CBC, CMP- normal.    In 1996 she was diagnosed. Never has never been treated. She thinks it was from her husband. She has tatoos. She has been incarcerated. No alcohol. No military service, No blood transfusions. Has used cocaine. Last used 15 years back.   Very poor connection. She was outside her house and there was a lot of echoing on the connection    Past Medical History:  Diagnosis Date  . ADD (attention deficit disorder)   . Anxiety   . Depression   . Fibromyalgia   . GERD (gastroesophageal reflux disease)   . Neuropathy   . Schizophrenia ([place])   . Scoliosis     Past Surgical History:  Procedure Laterality Date  .  BILATERAL CARPAL TUNNEL RELEASE Bilateral   . CHOLECYSTECTOMY    . FEMUR SURGERY Left   . JOINT REPLACEMENT    . REPLACEMENT TOTAL KNEE BILATERAL Bilateral     Prior to Admission medications   Medication Sig Start Date End Date Taking? Authorizing Provider  gabapentin (NEURONTIN) 400 MG capsule Take 1 capsule (400 mg total) by mouth 3 (three) times daily. 01/04/19  Yes Iloabachie, Chioma E, NP  QUEtiapine (SEROQUEL) 100 MG tablet Take 100 mg by mouth at bedtime.   Yes [provider]  sertraline (ZOLOFT) 50 MG tablet Take 50 mg by mouth daily.   Yes [provider]  benzonatate (TESSALON PERLES) 100 MG capsule Take 1 capsule (100 mg total) by mouth 3 (three) times daily as needed for cough. Patient not taking: Reported on 01/04/2019 11/20/18   Iloabachie, Chioma E, NP  buprenorphine-naloxone (SUBOXONE) 8-2 mg SUBL SL tablet Place 1 tablet under the tongue daily.    [provider]  cyclobenzaprine (FLEXERIL) 5 MG tablet Take 1-2 tablets 3 times daily as needed Patient not taking: Reported on 01/24/2019 09/16/18   Laban Emperor, PA-C  ibuprofen (ADVIL,MOTRIN) 200 MG tablet Take 3-4 tablets (600-800 mg total) by mouth every 8 (eight) hours as needed. Patient not taking: Reported on 01/24/2019 09/07/18   Iloabachie, Chioma E, NP  omeprazole (PRILOSEC) 20 MG capsule TAKE ONE CAPSULE BY MOUTH EVERY  DAY Patient not taking: Reported on 01/24/2019 11/18/18   Iloabachie, Chioma E, NP  oxybutynin (DITROPAN-XL) 10 MG 24 hr tablet Take 1 tablet (10 mg total) by mouth at bedtime. Patient not taking: Reported on 01/24/2019 12/14/18   Iloabachie, Chioma E, NP  predniSONE (DELTASONE) 10 MG tablet Take 6 tablets on day 1, take 5 tablets on day 2, take 4 tablets on day 3, take 3 tablets on day 4, take 2 tablets on day 5, take 1 tablet on day 6 Patient not taking: Reported on 01/04/2019 09/16/18   Laban Emperor, PA-C    No family history on file.   Social History   Tobacco Use  . Smoking  status: Current Every Day Smoker    Packs/day: 0.50    Years: 15.00    Pack years: 7.50  . Smokeless tobacco: Never Used  . Tobacco comment: patient has materials already  Substance Use Topics  . Alcohol use: Not Currently    Frequency: Never  . Drug use: Not Currently    Types: Marijuana    Allergies as of 01/24/2019 - Review Complete 01/24/2019  Allergen Reaction Noted  . Paxil [paroxetine hcl] Swelling 02/21/2018  . Mango flavor Hives 05/04/2018  . Morphine and related Itching 02/21/2018  . Trazodone and nefazodone  09/07/2018  . Oxycodone-acetaminophen Itching 10/09/2017    Review of Systems:    All systems reviewed and negative except where noted in HPI. General Appearance:    Alert, cooperative, no distress, appears stated age  Head:    Normocephalic, without obvious abnormality, atraumatic  Eyes:    PERRL, conjunctiva/corneas clear,  Ears:    Grossly normal hearing    Neurologic:   Grossly appears normal     Observations/Objective:  Labs: CBC    Component Value Date/Time   WBC 5.9 11/18/2018 1413   WBC 6.4 11/18/2018 1328   RBC 4.25 11/18/2018 1413   RBC 4.41 11/18/2018 1328   HGB 12.9 11/18/2018 1413   HCT 36.8 11/18/2018 1413   PLT 189 11/18/2018 1413   MCV 87 11/18/2018 1413   MCH 30.4 11/18/2018 1413   MCH 29.3 11/18/2018 1328   MCHC 35.1 11/18/2018 1413   MCHC 31.9 11/18/2018 1328   RDW 12.4 11/18/2018 1413   LYMPHSABS 1.5 11/18/2018 1413   MONOABS 0.4 06/14/2018 1138   EOSABS 0.2 11/18/2018 1413   BASOSABS 0.0 11/18/2018 1413   CMP     Component Value Date/Time   NA 138 11/18/2018 1328   NA 142 06/08/2018 1852   K 3.6 11/18/2018 1328   CL 108 11/18/2018 1328   CO2 24 11/18/2018 1328   GLUCOSE 97 11/18/2018 1328   BUN 11 11/18/2018 1328   BUN 23 06/08/2018 1852   CREATININE 0.54 11/18/2018 1328   CALCIUM 8.4 (L) 11/18/2018 1328   PROT 6.7 11/18/2018 1328   PROT 6.2 06/08/2018 1852   PROT 6.3 06/08/2018 1852   ALBUMIN 3.4 (L)  11/18/2018 1328   ALBUMIN 3.9 06/08/2018 1852   AST 31 11/18/2018 1328   ALT 20 11/18/2018 1328   ALKPHOS 87 11/18/2018 1328   BILITOT 0.4 11/18/2018 1328   BILITOT 0.2 06/08/2018 1852   GFRNONAA >60 11/18/2018 1328   GFRAA >60 11/18/2018 1328    Imaging Studies: No results found.  Assessment and Plan:   Monique Schaefer is a 53 y.o. y/o female has been referred for hepatitis C- positive viral load. H/o use of cocaine noted in urine back in 01/2018 although she states she last  used 15 years back.     Plan :   1. Check for Hep B/C , HIV,Hep A immune status 2. Urine drug screen if negative and demonstrates compliance to tests then will discuss tyreatment options at next visit  3. Liver elastrography   Follow Up Instructions:   8 weeks follow up  I discussed the assessment and treatment plan with the patient. The patient was provided an opportunity to ask questions and all were answered. The patient agreed with the plan and demonstrated an understanding of the instructions.   The patient was advised to call back or seek an in-person evaluation if the symptoms worsen or if the condition fails to improve as anticipated.    Dr Monique Bellows MD,MRCP [facility]) Gastroenterology/Hepatology Pager: [phone]   Speech recognition software was used to dictate the above note.

## 2019-01-25 ENCOUNTER — Encounter: Payer: Self-pay | Admitting: Gerontology

## 2019-01-25 ENCOUNTER — Other Ambulatory Visit: Payer: Self-pay

## 2019-01-25 ENCOUNTER — Ambulatory Visit: Payer: Self-pay | Admitting: Gerontology

## 2019-01-25 DIAGNOSIS — G629 Polyneuropathy, unspecified: Secondary | ICD-10-CM

## 2019-01-25 DIAGNOSIS — M5442 Lumbago with sciatica, left side: Secondary | ICD-10-CM

## 2019-01-25 DIAGNOSIS — M62830 Muscle spasm of back: Secondary | ICD-10-CM

## 2019-01-25 DIAGNOSIS — M62838 Other muscle spasm: Secondary | ICD-10-CM

## 2019-01-25 DIAGNOSIS — G8929 Other chronic pain: Secondary | ICD-10-CM

## 2019-01-25 DIAGNOSIS — M5441 Lumbago with sciatica, right side: Secondary | ICD-10-CM

## 2019-01-25 MED ORDER — BACLOFEN 10 MG PO TABS: 5.0000 mg | ORAL_TABLET | Freq: Two times a day (BID) | ORAL | 0 refills | Status: DC

## 2019-01-25 NOTE — Progress Notes (Signed)
Established Patient Office Visit  Subjective:  Patient ID: Monique Schaefer, female    DOB: [birth year]  Age: 53 y.o. MRN: [id number]  CC:  Chief Complaint  Patient presents with  . Follow-up    chronic pain   Patient consents to telephone visit and 2 patient identifier was used to identify patient.  HPI [name] presents for follow up for chronic muscle spasm to back and worsening neuropathy to lower legs. 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 She reports that her knuckles are swollen and she is unable to close her left hand. She reports that taking 400 mg Gabapentin tid relieves neuropathy to legs. She reports that that the muscle spasm to her lower back is worsening, she unable to sleep because of spasm and flexeril doesn't relieve symptoms. She denies weakness, bowel and bladder incontinence or saddle anesthesia. During the visit she was raising her voice, was very angry that the clinic was not doing anything for her, and all her referrals were counseled due to the pandemic. She states that she has enough money and will find a pain clinic to take care of her pain. The visit ended abruptly because she hung up the phone.  Past Medical History:  Diagnosis Date  . ADD (attention deficit disorder)   . Anxiety   . Depression   . Fibromyalgia   . GERD (gastroesophageal reflux disease)   . Neuropathy   . Schizophrenia ([place])   . Scoliosis     Past Surgical History:  Procedure Laterality Date  . BILATERAL CARPAL TUNNEL RELEASE Bilateral   . CHOLECYSTECTOMY    . FEMUR SURGERY Left   . JOINT REPLACEMENT    . REPLACEMENT TOTAL KNEE BILATERAL Bilateral     History reviewed. No pertinent family history.  Social History   Socioeconomic History  . Marital status: Single    Spouse name: Not on file  . Number of children: Not on file  . Years of education: Not on file  . Highest education level: Not on file   Occupational History  . Occupation: [employer]  . Financial resource strain: Somewhat hard  . Food insecurity:    Worry: Sometimes true    Inability: Sometimes true  . Transportation needs:    Medical: Yes    Non-medical: Yes  Tobacco Use  . Smoking status: Current Every Day Smoker    Packs/day: 0.50    Years: 15.00    Pack years: 7.50  . Smokeless tobacco: Never Used  . Tobacco comment: patient has materials already  Substance and Sexual Activity  . Alcohol use: Not Currently    Frequency: Never  . Drug use: Not Currently    Types: Marijuana  . Sexual activity: Not on file  Lifestyle  . Physical activity:    Days per week: 5 days    Minutes per session: Not on file  . Stress: Very much  Relationships  . Social connections:    Talks on phone: More than three times a week    Gets together: Never    Attends religious service: More than 4 times per year    Active member of club or organization: No    Attends meetings of clubs or organizations: Never    Relationship status: Divorced  . Intimate partner violence:    Fear of current or ex partner: No    Emotionally abused: No    Physically abused:  No    Forced sexual activity: No  Other Topics Concern  . Not on file  Social History Narrative   Pt has been in [place] for 9 days. Pt is living at homeless shelter. Food is taken care of as of now by the shelter. She has a bus pass. She has applied for a job.       [name] has been in [place] for quite some time since June of 2019. If she is staying at the shelter then she does not need [facility] services at this time.     Outpatient Medications Prior to Visit  Medication Sig Dispense Refill  . benzonatate (TESSALON PERLES) 100 MG capsule Take 1 capsule (100 mg total) by mouth 3 (three) times daily as needed for cough. 20 capsule 0  . buprenorphine-naloxone (SUBOXONE) 8-2 mg SUBL SL tablet Place 1 tablet under the tongue daily.    Marland Kitchen gabapentin (NEURONTIN) 400  MG capsule Take 1 capsule (400 mg total) by mouth 3 (three) times daily. 90 capsule 2  . ibuprofen (ADVIL,MOTRIN) 200 MG tablet Take 3-4 tablets (600-800 mg total) by mouth every 8 (eight) hours as needed. 30 tablet   . omeprazole (PRILOSEC) 20 MG capsule TAKE ONE CAPSULE BY MOUTH EVERY DAY 90 capsule 0  . oxybutynin (DITROPAN-XL) 10 MG 24 hr tablet Take 1 tablet (10 mg total) by mouth at bedtime. 30 tablet 1  . predniSONE (DELTASONE) 10 MG tablet Take 6 tablets on day 1, take 5 tablets on day 2, take 4 tablets on day 3, take 3 tablets on day 4, take 2 tablets on day 5, take 1 tablet on day 6 21 tablet 0  . QUEtiapine (SEROQUEL) 100 MG tablet Take 100 mg by mouth at bedtime.    . sertraline (ZOLOFT) 50 MG tablet Take 50 mg by mouth daily.    . cyclobenzaprine (FLEXERIL) 5 MG tablet Take 1-2 tablets 3 times daily as needed 20 tablet 0   No facility-administered medications prior to visit.     Allergies  Allergen Reactions  . Paxil [Paroxetine Hcl] Swelling  . Mango Flavor Hives  . Morphine And Related Itching  . Trazodone And Nefazodone   . Oxycodone-Acetaminophen Itching    ROS Review of Systems  Constitutional: Negative.   HENT: Negative.   Respiratory: Negative.   Cardiovascular: Negative.   Musculoskeletal: Positive for back pain (chronic back pain and muscle spasm) and myalgias (chronic muscle spasm).  Skin: Negative.   Neurological: Positive for numbness (neuropathy to legs).      Objective:    Physical Exam   No vital sign and PE was done.  LMP  (LMP Unknown)  Wt Readings from Last 3 Encounters:  11/18/18 215 lb (97.5 kg)  09/16/18 226 lb (102.5 kg)  09/07/18 213 lb 1.6 oz (96.7 kg)     Health Maintenance Due  Topic Date Due  . HIV Screening  06/07/1981  . TETANUS/TDAP  06/07/1985  . PAP SMEAR-Modifier  06/08/1987  . MAMMOGRAM  06/07/2016  . COLONOSCOPY  06/07/2016    There are no preventive care reminders to display for this patient.  Lab Results   Component Value Date   TSH 0.458 05/04/2018   Lab Results  Component Value Date   WBC 5.9 11/18/2018   HGB 12.9 11/18/2018   HCT 36.8 11/18/2018   MCV 87 11/18/2018   PLT 189 11/18/2018   Lab Results  Component Value Date   NA 138 11/18/2018   K 3.6 11/18/2018  CO2 24 11/18/2018   GLUCOSE 97 11/18/2018   BUN 11 11/18/2018   CREATININE 0.54 11/18/2018   BILITOT 0.4 11/18/2018   ALKPHOS 87 11/18/2018   AST 31 11/18/2018   ALT 20 11/18/2018   PROT 6.7 11/18/2018   ALBUMIN 3.4 (L) 11/18/2018   CALCIUM 8.4 (L) 11/18/2018   ANIONGAP 6 11/18/2018   Lab Results  Component Value Date   CHOL 149 03/24/2018   Lab Results  Component Value Date   HDL 71 03/24/2018   Lab Results  Component Value Date   LDLCALC 65 03/24/2018   Lab Results  Component Value Date   TRIG 67 03/24/2018   Lab Results  Component Value Date   CHOLHDL 2.1 03/24/2018   Lab Results  Component Value Date   HGBA1C 5.3 03/24/2018      Assessment & Plan:    1. Neuropathy - She will continue on current treatment regimen. - She will follow up with Neurology 2. Chronic midline low back pain with bilateral sciatica - She was advised to go to the emergency room with worsening symptoms - She will be referred to pain clinic  3. Muscle spasm of back - baclofen (LIORESAL) 10 MG tablet; Take 0.5 tablets (5 mg total) by mouth 2 (two) times daily.  Dispense: 60 each; Refill: 0  4. Muscle spasm of both lower legs - baclofen (LIORESAL) 10 MG tablet; Take 0.5 tablets (5 mg total) by mouth 2 (two) times daily.  Dispense: 60 each; Refill: 0   Follow-up: Return in about 2 weeks (around 02/08/2019), or if symptoms worsen or fail to improve.    Sophi Calligan Jerold Coombe, NP

## 2019-01-25 NOTE — Patient Instructions (Signed)

## 2019-01-28 ENCOUNTER — Emergency Department: Payer: Self-pay

## 2019-01-28 ENCOUNTER — Emergency Department
Admission: EM | Admit: 2019-01-28 | Discharge: 2019-01-28 | Disposition: A | Discharge status: Home / Self Care | Payer: Self-pay | Attending: Emergency Medicine | Admitting: Emergency Medicine

## 2019-01-28 ENCOUNTER — Other Ambulatory Visit: Payer: Self-pay

## 2019-01-28 DIAGNOSIS — F17210 Nicotine dependence, cigarettes, uncomplicated: Secondary | ICD-10-CM | POA: Insufficient documentation

## 2019-01-28 DIAGNOSIS — Z79899 Other long term (current) drug therapy: Secondary | ICD-10-CM | POA: Insufficient documentation

## 2019-01-28 DIAGNOSIS — Z96653 Presence of artificial knee joint, bilateral: Secondary | ICD-10-CM | POA: Insufficient documentation

## 2019-01-28 DIAGNOSIS — M654 Radial styloid tenosynovitis [de Quervain]: Secondary | ICD-10-CM

## 2019-01-28 MED ORDER — PREDNISONE 10 MG PO TABS: 10.0000 mg | ORAL_TABLET | Freq: Every day | ORAL | 0 refills | Status: DC

## 2019-01-28 NOTE — ED Provider Notes (Signed)
[facility] EMERGENCY DEPARTMENT Provider Note   CSN: [id number] Arrival date & time: 01/28/19  1448    History   Chief Complaint Chief Complaint  Patient presents with  . Hand Pain    HPI Monique Schaefer is a 53 y.o. female presents to the emergency department evaluation of left hand pain along the radial aspect of the radial styloid.  Pain is been present for 1 month.  No trauma or injury.  She has been working with her hands.  No warmth redness numbness or tingling.  No relief with ibuprofen.  No neck pain. HPI  Past Medical History:  Diagnosis Date  . ADD (attention deficit disorder)   . Anxiety   . Depression   . Fibromyalgia   . GERD (gastroesophageal reflux disease)   . Neuropathy   . Schizophrenia ([place])   . Scoliosis     Patient Active Problem List   Diagnosis Date Noted  . Cervical lymphadenopathy 06/08/2018  . Chronic active hepatitis ([place]) 06/08/2018  . Bilateral foot pain 05/04/2018  . Neuropathy 05/04/2018  . Elevated TSH 05/04/2018  . Exposure to hepatitis C 05/04/2018  . Back pain 03/04/2018  . Health care maintenance 03/04/2018  . 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 Cocaine abuse ([place]) 02/21/2018  . Cannabis abuse 02/21/2018  . Homelessness 02/21/2018    Past Surgical History:  Procedure Laterality Date  . BILATERAL CARPAL TUNNEL RELEASE Bilateral   . CHOLECYSTECTOMY    . FEMUR SURGERY Left   . JOINT REPLACEMENT    . REPLACEMENT TOTAL KNEE BILATERAL Bilateral      OB History   No obstetric history on file.      Home Medications    Prior to Admission medications   Medication Sig Start Date End Date Taking? Authorizing Provider  baclofen (LIORESAL) 10 MG tablet Take 0.5 tablets (5 mg total) by mouth 2 (two) times daily. 01/25/19   Iloabachie, Chioma E, NP  benzonatate (TESSALON PERLES) 100 MG capsule Take 1 capsule (100 mg total) by mouth 3 (three) times daily as needed  for cough. 11/20/18   Iloabachie, Chioma E, NP  buprenorphine-naloxone (SUBOXONE) 8-2 mg SUBL SL tablet Place 1 tablet under the tongue daily.    [provider]  gabapentin (NEURONTIN) 400 MG capsule Take 1 capsule (400 mg total) by mouth 3 (three) times daily. 01/04/19   Iloabachie, Chioma E, NP  ibuprofen (ADVIL,MOTRIN) 200 MG tablet Take 3-4 tablets (600-800 mg total) by mouth every 8 (eight) hours as needed. 09/07/18   Iloabachie, Chioma E, NP  omeprazole (PRILOSEC) 20 MG capsule TAKE ONE CAPSULE BY MOUTH EVERY DAY 11/18/18   Iloabachie, Chioma E, NP  oxybutynin (DITROPAN-XL) 10 MG 24 hr tablet Take 1 tablet (10 mg total) by mouth at bedtime. 12/14/18   Iloabachie, Chioma E, NP  predniSONE (DELTASONE) 10 MG tablet Take 1 tablet (10 mg total) by mouth daily. 6,5,4,3,2,1 six day taper 01/28/19   Duanne Guess, PA-C  QUEtiapine (SEROQUEL) 100 MG tablet Take 100 mg by mouth at bedtime.    [provider]  sertraline (ZOLOFT) 50 MG tablet Take 50 mg by mouth daily.    [provider]    Family History No family history on file.  Social History Social History   Tobacco Use  . Smoking status: Current Every Day Smoker    Packs/day: 0.50    Years: 15.00    Pack years: 7.50  . Smokeless tobacco: Never  Used  . Tobacco comment: patient has materials already  Substance Use Topics  . Alcohol use: Not Currently    Frequency: Never  . Drug use: Not Currently    Types: Marijuana     Allergies   Paxil [paroxetine hcl]; Mango flavor; Morphine and related; Trazodone and nefazodone; and Oxycodone-acetaminophen   Review of Systems Review of Systems  Constitutional: Negative for chills and fever.  Musculoskeletal: Positive for arthralgias. Negative for gait problem, joint swelling, myalgias and neck pain.  Skin: Negative for rash and wound.     Physical Exam Updated Vital Signs BP (!) 115/92 (BP Location: Right Arm)   Pulse 73   Temp 98.1 F (36.7 C) (Oral)    Resp 18   Ht 5\' 5"  (1.651 m)   Wt 88.5 kg   LMP  (LMP Unknown)   SpO2 98%   BMI 32.45 kg/m   Physical Exam Constitutional:      Appearance: She is well-developed.  HENT:     Head: Normocephalic and atraumatic.  Eyes:     Conjunctiva/sclera: Conjunctivae normal.  Neck:     Musculoskeletal: Normal range of motion.  Cardiovascular:     Rate and Rhythm: Normal rate.  Pulmonary:     Effort: Pulmonary effort is normal. No respiratory distress.  Musculoskeletal:     Comments: Left hand with full composite fist.  No warmth redness or swelling.  She has tenderness on the radial styloid with a positive Finkelstein's test.  Negative Tinel's and Phalen sign.  No muscle atrophy.  Skin:    General: Skin is warm.     Findings: No rash.  Neurological:     General: No focal deficit present.     Mental Status: She is alert and oriented to person, place, and time.  Psychiatric:        Behavior: Behavior normal.        Thought Content: Thought content normal.      ED Treatments / Results  Labs (all labs ordered are listed, but only abnormal results are displayed) Labs Reviewed - No data to display  EKG None  Radiology Dg Hand Complete Left  Result Date: 01/28/2019 CLINICAL DATA:  Left hand pain for the past month. EXAM: LEFT HAND - COMPLETE 3+ VIEW COMPARISON:  None. FINDINGS: No acute fracture or dislocation. No bony erosions or periostitis. Mild thumb IP joint osteoarthritis. Additional scattered IP joint marginal osteophytes with relatively preserved joint spaces. Bone mineralization is normal. Soft tissues are unremarkable. IMPRESSION: 1.  No acute osseous abnormality. 2. Mild IP joint degenerative changes. Electronically Signed   By: Titus Dubin M.D.   On: 01/28/2019 16:40    Procedures Procedures (including critical care time)  Medications Ordered in ED Medications - No data to display   Initial Impression / Assessment and Plan / ED Course  I have reviewed the triage  vital signs and the nursing notes.  Pertinent labs & imaging results that were available during my care of the patient were reviewed by me and considered in my medical decision making (see chart for details).        53 year old female with history exam consistent with de Quervain's tenosynovitis.  Patient placed into a thumb spica splint.  X-rays show no evidence of acute bony abnormality.  No significant CMC arthropathy.  Patient is placed on a steroid taper.  She will follow with orthopedics if no improvement 1 week.  Final Clinical Impressions(s) / ED Diagnoses   Final diagnoses:  Monique Schaefer  disease (tenosynovitis)    ED Discharge Orders         Ordered    predniSONE (DELTASONE) 10 MG tablet  Daily     01/28/19 1654           Renata Caprice 01/28/19 [place], [place], MD 01/28/19 [phone]

## 2019-01-28 NOTE — ED Triage Notes (Signed)
Left hand pain that radiates up to left arm and into forearm X 1 month. No known injury.

## 2019-01-28 NOTE — Discharge Instructions (Addendum)
Please wear thumb spica splint.  Take medication as prescribed.  Avoid grasping gripping and lifting.  Follow-up with orthopedics if no improvement 2 days.

## 2019-02-10 ENCOUNTER — Telehealth: Payer: Self-pay | Admitting: Gastroenterology

## 2019-02-10 NOTE — Telephone Encounter (Signed)
Spoke with pt and informed her of the appointment time change.

## 2019-02-10 NOTE — Telephone Encounter (Signed)
Maria from Centralized scheduling left vm she spoke with [place] regarding ultrasound for pt . Ptr ultrasound was rescheduled to 9:30 am on 03/07/19 the exam is only done in AM  Pt was scheduled for 1:30 Pm she tried reaching out to pt but was unable she did send her a letter regarding this change Verdis Frederickson cb [phone]

## 2019-02-15 ENCOUNTER — Ambulatory Visit: Payer: Self-pay | Admitting: Gastroenterology

## 2019-02-21 IMAGING — CR DG LUMBAR SPINE 2-3V
1 series · 4 of 4 positions shown · non-contrast
Comparison: None.

CLINICAL DATA: Severe back pain radiating into the right leg after
lifting a heavy object at work.

EXAM:
LUMBAR SPINE - 2-3 VIEW

[Series 1: dg lumbar spine 2-3 views · 0.14mm/px · 4 of 4 slices shown]
[im 1/4]
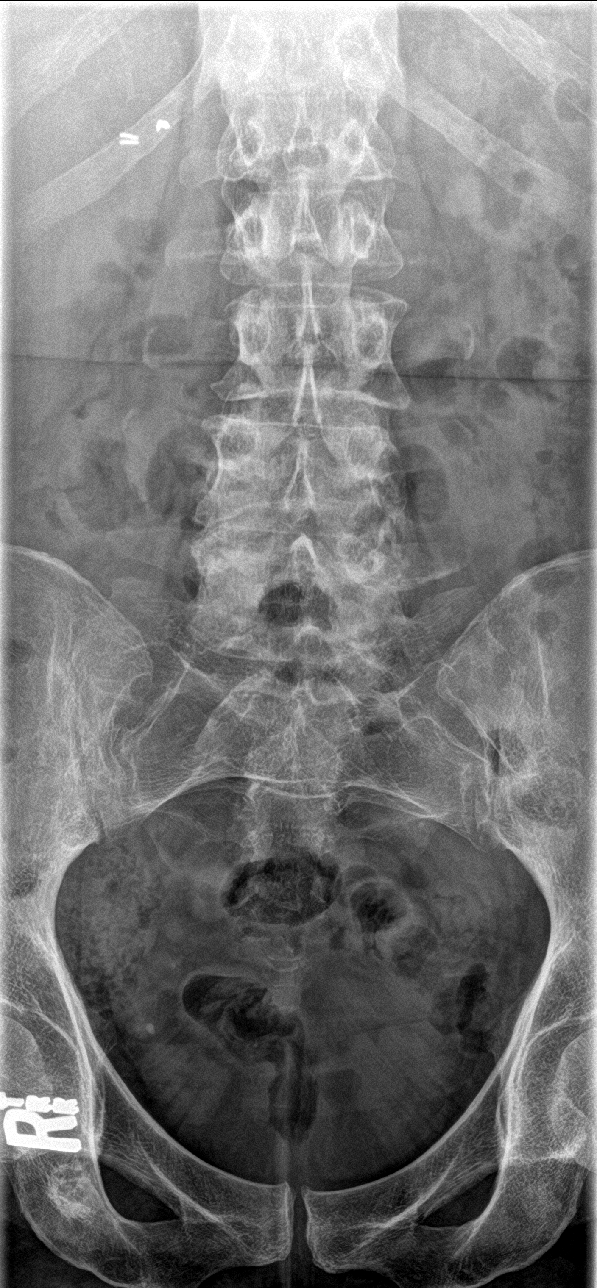
[im 2/4]
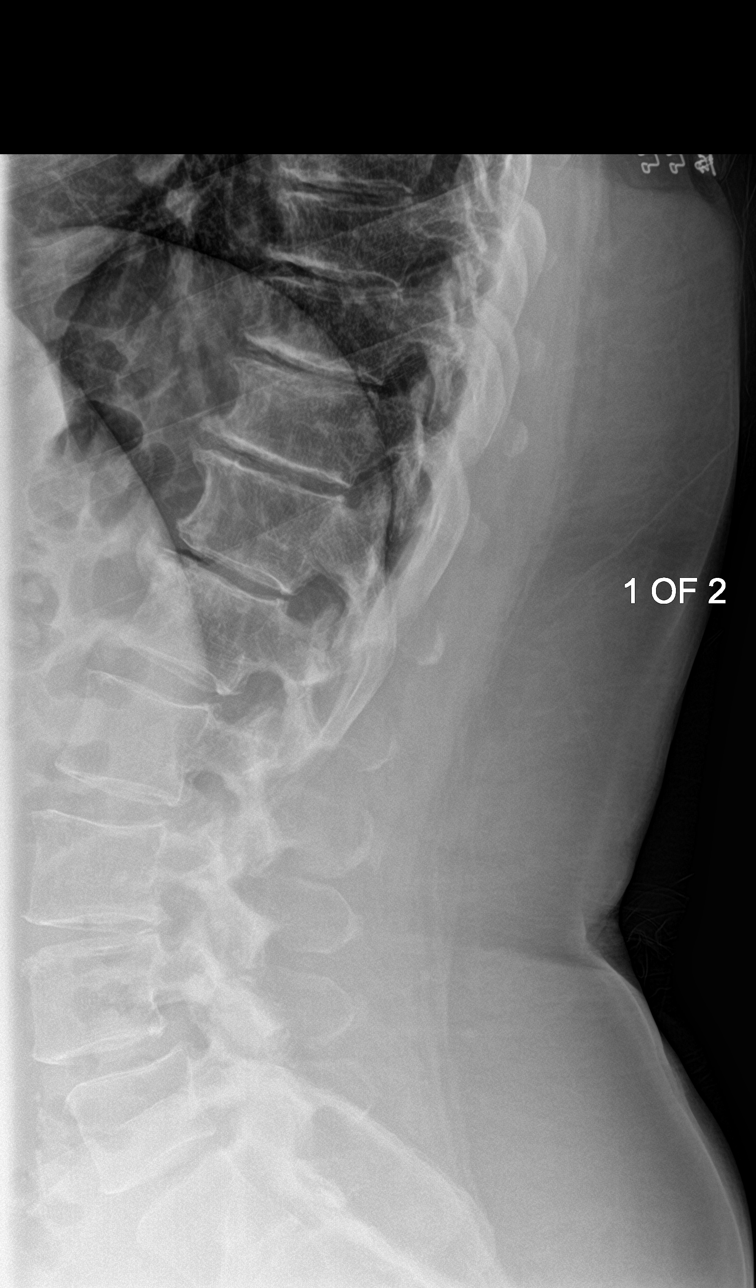
[im 3/4]
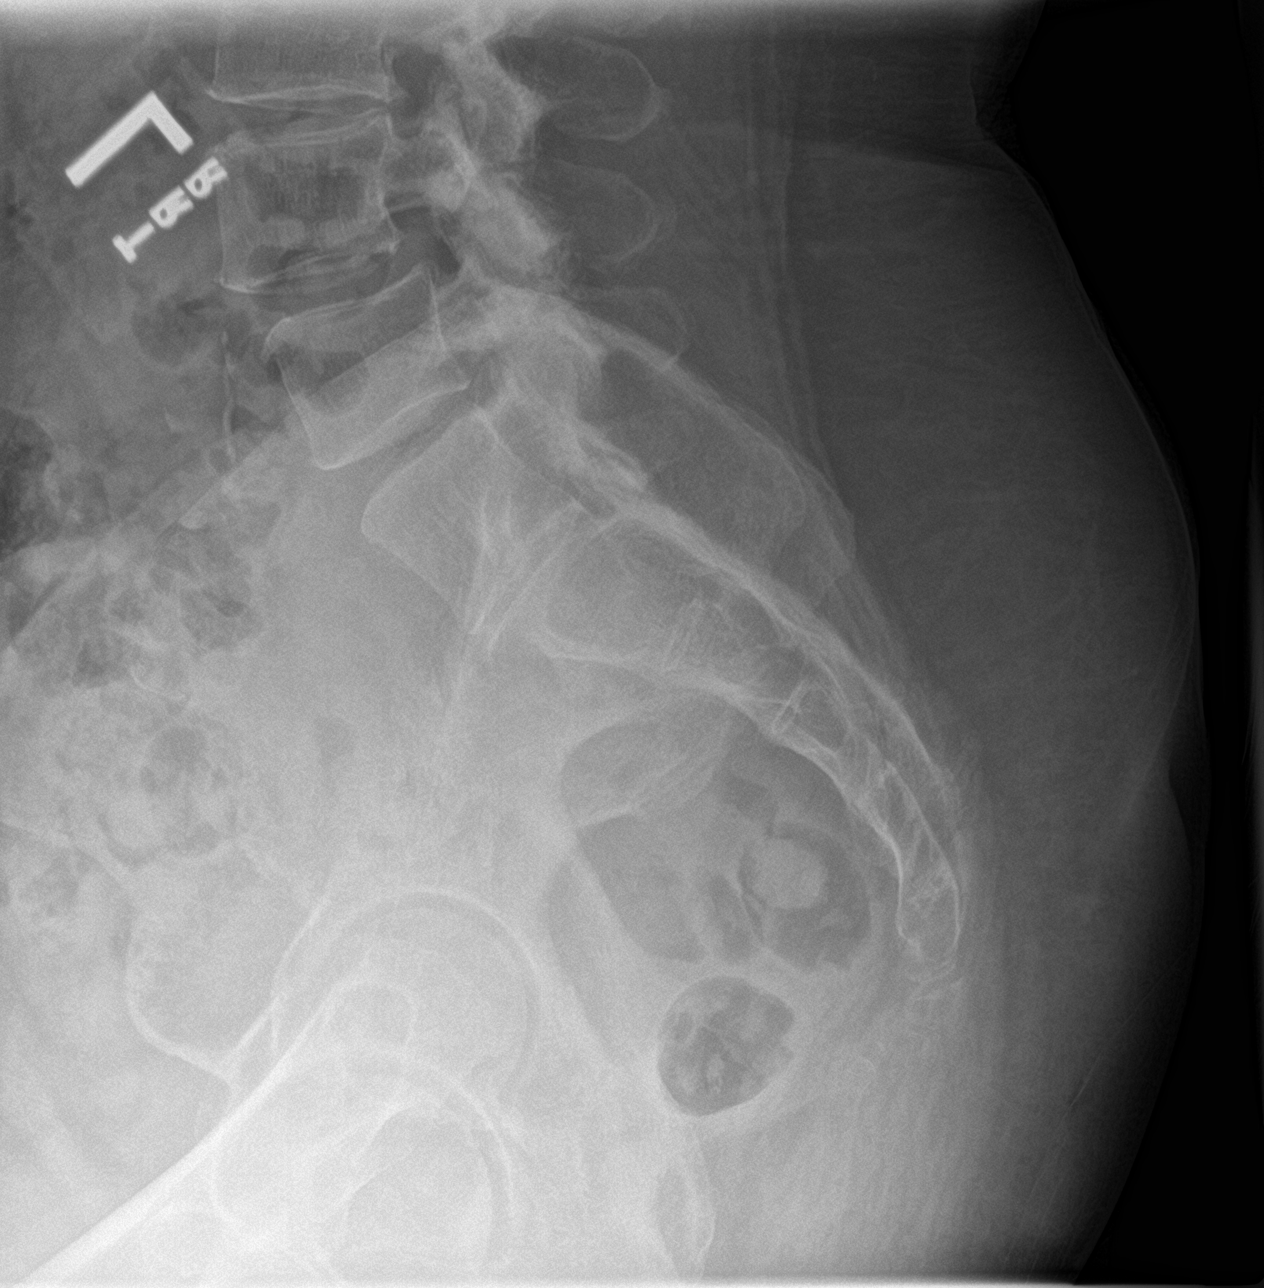
[im 4/4]
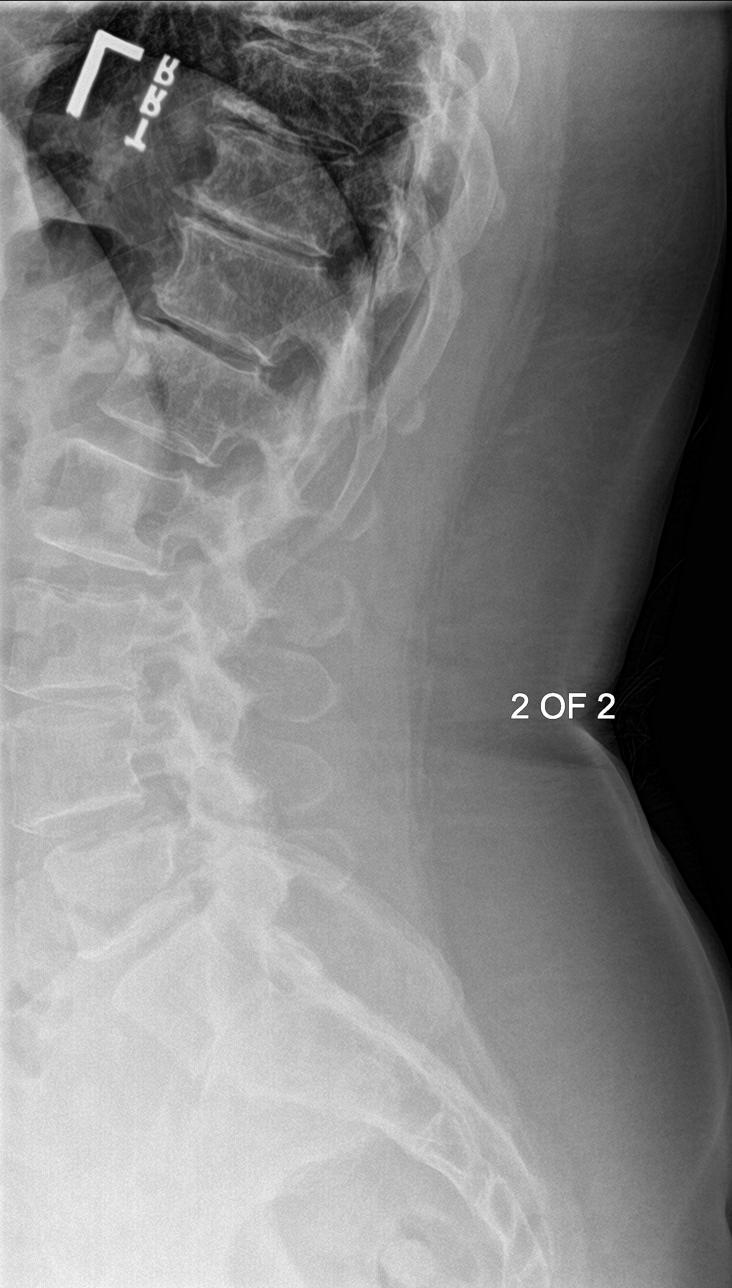

[4 of 4 positions shown; findings below may reference images not displayed]

FINDINGS: Transitional lumbosacral anatomy with lumbarization of S1. The last
well-formed disc space is designated L5-S1 for the purposes of this
report. Vestigial disc space at S1-S2. No acute fracture or
subluxation. Vertebral body heights are preserved. Trace
retrolisthesis at L1-L2. 5 mm anterolisthesis at L4-L5. Mild disc
height loss at T12-L1, L3-L4, and L4-L5. The sacroiliac joints are
unremarkable.
IMPRESSION: 1.  No acute osseous abnormality.
2. Mild degenerative changes of the lumbar spine with grade 1
anterolisthesis at L4-L5.
3. Transitional lumbosacral anatomy with lumbarization of S1.
Correlation with radiographs is recommended prior to any operative
intervention.

## 2019-03-07 ENCOUNTER — Ambulatory Visit: Admission: RE | Admit: 2019-03-07 | Payer: Self-pay | Source: Ambulatory Visit

## 2019-03-07 ENCOUNTER — Ambulatory Visit: Payer: Self-pay

## 2019-03-07 ENCOUNTER — Telehealth: Payer: Self-pay | Admitting: Gastroenterology

## 2019-03-07 NOTE — Telephone Encounter (Signed)
Stephanie from Ultrasound dep. Is calling to give [place] a curtsey call pt did not show for the ultrasound today

## 2019-03-08 ENCOUNTER — Encounter: Payer: Self-pay | Admitting: Medical Oncology

## 2019-03-08 ENCOUNTER — Other Ambulatory Visit: Payer: Self-pay

## 2019-03-08 ENCOUNTER — Emergency Department
Admission: EM | Admit: 2019-03-08 | Discharge: 2019-03-08 | Disposition: A | Discharge status: Home / Self Care | Payer: Self-pay | Attending: Student in an Organized Health Care Education/Training Program | Admitting: Student in an Organized Health Care Education/Training Program

## 2019-03-08 DIAGNOSIS — Y939 Activity, unspecified: Secondary | ICD-10-CM | POA: Insufficient documentation

## 2019-03-08 DIAGNOSIS — Y999 Unspecified external cause status: Secondary | ICD-10-CM | POA: Insufficient documentation

## 2019-03-08 DIAGNOSIS — Z79899 Other long term (current) drug therapy: Secondary | ICD-10-CM | POA: Insufficient documentation

## 2019-03-08 DIAGNOSIS — B379 Candidiasis, unspecified: Secondary | ICD-10-CM | POA: Insufficient documentation

## 2019-03-08 DIAGNOSIS — W57XXXA Bitten or stung by nonvenomous insect and other nonvenomous arthropods, initial encounter: Secondary | ICD-10-CM | POA: Insufficient documentation

## 2019-03-08 DIAGNOSIS — Y929 Unspecified place or not applicable: Secondary | ICD-10-CM | POA: Insufficient documentation

## 2019-03-08 DIAGNOSIS — F17210 Nicotine dependence, cigarettes, uncomplicated: Secondary | ICD-10-CM | POA: Insufficient documentation

## 2019-03-08 DIAGNOSIS — Z96653 Presence of artificial knee joint, bilateral: Secondary | ICD-10-CM | POA: Insufficient documentation

## 2019-03-08 MED ORDER — HYDROXYZINE HCL 10 MG PO TABS: 10.0000 mg | ORAL_TABLET | Freq: Three times a day (TID) | ORAL | 0 refills | Status: DC | PRN

## 2019-03-08 MED ORDER — PREDNISONE 10 MG (21) PO TBPK: ORAL_TABLET | ORAL | 0 refills | Status: DC

## 2019-03-08 MED ORDER — CLOTRIMAZOLE-BETAMETHASONE 1-0.05 % EX CREA: 1.0000 "application " | TOPICAL_CREAM | Freq: Two times a day (BID) | CUTANEOUS | 0 refills | Status: DC

## 2019-03-08 NOTE — ED Triage Notes (Signed)
Pt reports itchy rash all over x 1 week, recently moved into new apartment.

## 2019-03-08 NOTE — ED Provider Notes (Signed)
[facility] Emergency Department Provider Note  ____________________________________________   First MD Initiated Contact with Patient 03/08/19 1224     (approximate)  I have reviewed the triage vital signs and the nursing notes.   HISTORY  Chief Complaint Rash    HPI Monique Schaefer is a 53 y.o. female presents emergency department complaining of rash and itching.  She states that she moved into a new apartment and noticed some bumps on the lower extremities.  She has been itching and scratching since then.  No fever or chills.  No other known exposures.  All of the furniture that was moved into the house was hers and had not been stored.    Past Medical History:  Diagnosis Date  . ADD (attention deficit disorder)   . Anxiety   . Depression   . Fibromyalgia   . GERD (gastroesophageal reflux disease)   . Neuropathy   . Schizophrenia ([place])   . Scoliosis     Patient Active Problem List   Diagnosis Date Noted  . Cervical lymphadenopathy 06/08/2018  . Chronic active hepatitis ([place]) 06/08/2018  . Bilateral foot pain 05/04/2018  . Neuropathy 05/04/2018  . Elevated TSH 05/04/2018  . Exposure to hepatitis C 05/04/2018  . Back pain 03/04/2018  . Health care maintenance 03/04/2018  . 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 Cocaine abuse ([place]) 02/21/2018  . Cannabis abuse 02/21/2018  . Homelessness 02/21/2018    Past Surgical History:  Procedure Laterality Date  . BILATERAL CARPAL TUNNEL RELEASE Bilateral   . CHOLECYSTECTOMY    . FEMUR SURGERY Left   . JOINT REPLACEMENT    . REPLACEMENT TOTAL KNEE BILATERAL Bilateral     Prior to Admission medications   Medication Sig Start Date End Date Taking? Authorizing Provider  buprenorphine-naloxone (SUBOXONE) 8-2 mg SUBL SL tablet Place 1 tablet under the tongue daily.    [provider]  clotrimazole-betamethasone (LOTRISONE) cream Apply 1  application topically 2 (two) times daily. 03/08/19   Harvis Mabus, Linden Dolin, PA-C  gabapentin (NEURONTIN) 400 MG capsule Take 1 capsule (400 mg total) by mouth 3 (three) times daily. 01/04/19   Iloabachie, Chioma E, NP  hydrOXYzine (ATARAX/VISTARIL) 10 MG tablet Take 1 tablet (10 mg total) by mouth 3 (three) times daily as needed. 03/08/19   Filippa Yarbough, Linden Dolin, PA-C  omeprazole (PRILOSEC) 20 MG capsule TAKE ONE CAPSULE BY MOUTH EVERY DAY 11/18/18   Iloabachie, Chioma E, NP  oxybutynin (DITROPAN-XL) 10 MG 24 hr tablet Take 1 tablet (10 mg total) by mouth at bedtime. 12/14/18   Iloabachie, Chioma E, NP  predniSONE (STERAPRED UNI-PAK 21 TAB) 10 MG (21) TBPK tablet Take 6 pills on day one then decrease by 1 pill each day 03/08/19   Versie Starks, PA-C  QUEtiapine (SEROQUEL) 100 MG tablet Take 100 mg by mouth at bedtime.    [provider]  sertraline (ZOLOFT) 50 MG tablet Take 50 mg by mouth daily.    [provider]    Allergies Paxil [paroxetine hcl]; Mango flavor; Morphine and related; Trazodone and nefazodone; and Oxycodone-acetaminophen  No family history on file.  Social History Social History   Tobacco Use  . Smoking status: Current Every Day Smoker    Packs/day: 0.50    Years: 15.00    Pack years: 7.50  . Smokeless tobacco: Never Used  . Tobacco comment: patient has materials already  Substance Use Topics  . Alcohol use: Not Currently  Frequency: Never  . Drug use: Not Currently    Types: Marijuana    Review of Systems  Constitutional: No fever/chills Eyes: No visual changes. ENT: No sore throat. Respiratory: Denies cough Genitourinary: Negative for dysuria. Musculoskeletal: Negative for back pain. Skin: Positive for rash and itching.    ____________________________________________   PHYSICAL EXAM:  VITAL SIGNS: ED Triage Vitals [03/08/19 1151]  Enc Vitals Group     BP (!) 153/95     Pulse Rate (!) 57     Resp 18     Temp 97.9 F (36.6 C)     Temp  Source Oral     SpO2 98 %     Weight 194 lb 0.1 oz (88 kg)     Height      Head Circumference      Peak Flow      Pain Score 0     Pain Loc      Pain Edu?      Excl. in [place]?     Constitutional: Alert and oriented. Well appearing and in no acute distress. Eyes: Conjunctivae are normal.  Head: Atraumatic. Nose: No congestion/rhinnorhea. Mouth/Throat: Mucous membranes are moist.   Neck:  supple no lymphadenopathy noted Cardiovascular: Normal rate, regular rhythm. Heart sounds are normal Respiratory: Normal respiratory effort.  No retractions, lungs c t a  GU: deferred Musculoskeletal: FROM all extremities, warm and well perfused Neurologic:  Normal speech and language.  Skin:  Skin is warm, dry and intact. Small red insect bites on lower extremities b/l, no scabies like rash, no vesicles.  Rash under her breast is more yeastlike. Psychiatric: Mood and affect are normal. Speech and behavior are normal.  ____________________________________________   LABS (all labs ordered are listed, but only abnormal results are displayed)  Labs Reviewed - No data to display ____________________________________________   ____________________________________________  RADIOLOGY    ____________________________________________   PROCEDURES  Procedure(s) performed: No  Procedures    ____________________________________________   INITIAL IMPRESSION / ASSESSMENT AND PLAN / ED COURSE  Pertinent labs & imaging results that were available during my care of the patient were reviewed by me and considered in my medical decision making (see chart for details).   Patient is 53 year old female presents emergency department complaining of a rash and itching.  She is also requesting a prescription for the medication she was given for her wrist which is Sterapred.  States she lost the prescription.  Physical exam patient appears well.  She is scratching a lot.  Small pinkish-red bite marks are  noted on the legs typical of fleas.  Rash under the breast appears to be yeastlike.  Explained findings to the patient.  Reassured her she does not have scabies.  Told her this is more typical of a flea bite.  She is given a prescription for Sterapred, Atarax, and Lotrisone for yeast under the breasts.  She was discharged in stable condition.     As part of my medical decision making, I reviewed the following data within the [place] notes reviewed and incorporated, Old chart reviewed, Notes from prior ED visits and [place] Controlled Substance Database  ____________________________________________   FINAL CLINICAL IMPRESSION(S) / ED DIAGNOSES  Final diagnoses:  Flea bite, initial encounter  Candidiasis      NEW MEDICATIONS STARTED DURING THIS VISIT:  Discharge Medication List as of 03/08/2019 12:33 PM    START taking these medications   Details  clotrimazole-betamethasone (LOTRISONE) cream Apply 1 application topically 2 (  two) times daily., Starting Tue 03/08/2019, Normal    hydrOXYzine (ATARAX/VISTARIL) 10 MG tablet Take 1 tablet (10 mg total) by mouth 3 (three) times daily as needed., Starting Tue 03/08/2019, Normal    predniSONE (STERAPRED UNI-PAK 21 TAB) 10 MG (21) TBPK tablet Take 6 pills on day one then decrease by 1 pill each day, Normal         Note:  This document was prepared using Dragon voice recognition software and may include unintentional dictation errors.    Versie Starks, PA-C 03/08/19 1321    Merlyn Lot, MD 03/08/19 [phone]

## 2019-03-08 NOTE — Discharge Instructions (Addendum)
Most likely the bites on your lower legs are from fleas.  Call your landlord about spraying your apartment. Take the medication as prescribed.  REturn if worsening

## 2019-03-08 NOTE — ED Notes (Signed)
See triage note   Presents with generalized  Rash and itching  Unsure what she came in contact with.. States he moved into a new apartment    Also having some pain to wrist area   States was seen for same in May but lost rx

## 2019-03-10 NOTE — Telephone Encounter (Signed)
Spoke with pt regarding her missed ultrasound appointment. Pt states she did not have the money to pay for the exam. Pt states she will reschedule once she begins her health insurance plan.

## 2019-03-27 ENCOUNTER — Emergency Department
Admission: EM | Admit: 2019-03-27 | Discharge: 2019-03-27 | Disposition: A | Discharge status: Home / Self Care | Payer: Self-pay | Attending: Student in an Organized Health Care Education/Training Program | Admitting: Student in an Organized Health Care Education/Training Program

## 2019-03-27 ENCOUNTER — Emergency Department: Payer: Self-pay

## 2019-03-27 ENCOUNTER — Other Ambulatory Visit: Payer: Self-pay

## 2019-03-27 DIAGNOSIS — F17210 Nicotine dependence, cigarettes, uncomplicated: Secondary | ICD-10-CM | POA: Insufficient documentation

## 2019-03-27 DIAGNOSIS — X500XXA Overexertion from strenuous movement or load, initial encounter: Secondary | ICD-10-CM | POA: Insufficient documentation

## 2019-03-27 DIAGNOSIS — M5441 Lumbago with sciatica, right side: Secondary | ICD-10-CM | POA: Insufficient documentation

## 2019-03-27 DIAGNOSIS — Z79899 Other long term (current) drug therapy: Secondary | ICD-10-CM | POA: Insufficient documentation

## 2019-03-27 MED ORDER — ORPHENADRINE CITRATE 30 MG/ML IJ SOLN
60.0000 mg | Freq: Two times a day (BID) | INTRAMUSCULAR | Status: DC
  Administered 2019-03-27: 60 mg via INTRAMUSCULAR
  Filled 2019-03-27: qty 2

## 2019-03-27 MED ORDER — KETOROLAC TROMETHAMINE 30 MG/ML IJ SOLN
30.0000 mg | Freq: Once | INTRAMUSCULAR | Status: AC
  Administered 2019-03-27: 30 mg via INTRAMUSCULAR
  Filled 2019-03-27: qty 1

## 2019-03-27 MED ORDER — LIDOCAINE 5 % EX PTCH
1.0000 | MEDICATED_PATCH | CUTANEOUS | Status: DC
  Administered 2019-03-27: 11:00:00 1 via TRANSDERMAL
  Filled 2019-03-27: qty 1

## 2019-03-27 MED ORDER — LIDOCAINE 5 % EX PTCH: 1.0000 | MEDICATED_PATCH | Freq: Two times a day (BID) | CUTANEOUS | 0 refills | Status: DC

## 2019-03-27 MED ORDER — KETOROLAC TROMETHAMINE 10 MG PO TABS: 10.0000 mg | ORAL_TABLET | Freq: Three times a day (TID) | ORAL | 0 refills | Status: DC | PRN

## 2019-03-27 MED ORDER — BACLOFEN 10 MG PO TABS: 5.0000 mg | ORAL_TABLET | Freq: Three times a day (TID) | ORAL | 0 refills | Status: DC

## 2019-03-27 MED ORDER — KETOROLAC TROMETHAMINE 10 MG PO TABS: 10.0000 mg | ORAL_TABLET | Freq: Three times a day (TID) | ORAL | 0 refills | Status: AC | PRN

## 2019-03-27 NOTE — ED Provider Notes (Signed)
[facility] Emergency Department Provider Note  ____________________________________________  Time seen: Approximately 10:22 AM  I have reviewed the triage vital signs and the nursing notes.   HISTORY  Chief Complaint Back Pain    HPI Monique Schaefer is a 53 y.o. female that presents to the emergency department for evaluation of right low back pain that radiates into right leg after injury this morning.  Patient states that she was lifting up a 25 pound bag of sugar at work this morning when she felt a pull in her back.  She did not feel a pop.  Pain starts in the low right side and radiates around right hip down into her right leg.  She has had issues with chronic back pain in the past.  [facility] but they requested that she follow-up with pain management or orthopedics.  However she was not given referral to either.  No bowel or bladder dysfunction or saddle anesthesias.  No additional injuries.  Her boss drove her to the emergency department.   Past Medical History:  Diagnosis Date  . ADD (attention deficit disorder)   . Anxiety   . Depression   . Fibromyalgia   . GERD (gastroesophageal reflux disease)   . Neuropathy   . Schizophrenia ([place])   . Scoliosis     Patient Active Problem List   Diagnosis Date Noted  . Cervical lymphadenopathy 06/08/2018  . Chronic active hepatitis ([place]) 06/08/2018  . Bilateral foot pain 05/04/2018  . Neuropathy 05/04/2018  . Elevated TSH 05/04/2018  . Exposure to hepatitis C 05/04/2018  . Back pain 03/04/2018  . Health care maintenance 03/04/2018  . 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 Cocaine abuse ([place]) 02/21/2018  . Cannabis abuse 02/21/2018  . Homelessness 02/21/2018    Past Surgical History:  Procedure Laterality Date  . BILATERAL CARPAL TUNNEL RELEASE Bilateral   . CHOLECYSTECTOMY    . FEMUR SURGERY Left   . JOINT REPLACEMENT     . REPLACEMENT TOTAL KNEE BILATERAL Bilateral     Prior to Admission medications   Medication Sig Start Date End Date Taking? Authorizing Provider  baclofen (LIORESAL) 10 MG tablet Take 0.5 tablets (5 mg total) by mouth 3 (three) times daily. 03/27/19 03/26/20  Laban Emperor, PA-C  buprenorphine-naloxone (SUBOXONE) 8-2 mg SUBL SL tablet Place 1 tablet under the tongue daily.    [provider]  clotrimazole-betamethasone (LOTRISONE) cream Apply 1 application topically 2 (two) times daily. 03/08/19   Fisher, Linden Dolin, PA-C  gabapentin (NEURONTIN) 400 MG capsule Take 1 capsule (400 mg total) by mouth 3 (three) times daily. 01/04/19   Iloabachie, Chioma E, NP  hydrOXYzine (ATARAX/VISTARIL) 10 MG tablet Take 1 tablet (10 mg total) by mouth 3 (three) times daily as needed. 03/08/19   Fisher, Linden Dolin, PA-C  ketorolac (TORADOL) 10 MG tablet Take 1 tablet (10 mg total) by mouth every 8 (eight) hours as needed for up to 5 days. 03/27/19 04/01/19  Laban Emperor, PA-C  lidocaine (LIDODERM) 5 % Place 1 patch onto the skin every 12 (twelve) hours. Remove & Discard patch within 12 hours or as directed by MD 03/27/19 03/26/20  Laban Emperor, PA-C  omeprazole (PRILOSEC) 20 MG capsule TAKE ONE CAPSULE BY MOUTH EVERY DAY 11/18/18   Iloabachie, Chioma E, NP  oxybutynin (DITROPAN-XL) 10 MG 24 hr tablet Take 1 tablet (10 mg total) by mouth at bedtime. 12/14/18   Iloabachie, Chioma E,  NP  predniSONE (STERAPRED UNI-PAK 21 TAB) 10 MG (21) TBPK tablet Take 6 pills on day one then decrease by 1 pill each day 03/08/19   Versie Starks, PA-C  QUEtiapine (SEROQUEL) 100 MG tablet Take 100 mg by mouth at bedtime.    [provider]  sertraline (ZOLOFT) 50 MG tablet Take 50 mg by mouth daily.    [provider]    Allergies Paxil [paroxetine hcl], Mango flavor, Morphine and related, Tramadol, Trazodone and nefazodone, and Oxycodone-acetaminophen  No family history on file.  Social History Social History    Tobacco Use  . Smoking status: Current Every Day Smoker    Packs/day: 0.50    Years: 15.00    Pack years: 7.50  . Smokeless tobacco: Never Used  . Tobacco comment: patient has materials already  Substance Use Topics  . Alcohol use: Not Currently    Frequency: Never  . Drug use: Not Currently    Types: Marijuana     Review of Systems  Respiratory: No SOB. Gastrointestinal: No abdominal pain.  No nausea, no vomiting.  Genitourinary: Negative for dysuria. Musculoskeletal: Positive for low back pain. Skin: Negative for rash, abrasions, lacerations, ecchymosis. Neurological: Negative for headaches, numbness or tingling   ____________________________________________   PHYSICAL EXAM:  VITAL SIGNS: ED Triage Vitals [03/27/19 0945]  Enc Vitals Group     BP (!) 104/49     Pulse Rate 83     Resp 18     Temp 98 F (36.7 C)     Temp Source Oral     SpO2 97 %     Weight      Height      Head Circumference      Peak Flow      Pain Score 8     Pain Loc      Pain Edu?      Excl. in [place]?      Constitutional: Alert and oriented. Well appearing and in no acute distress. Eyes: Conjunctivae are normal. PERRL. EOMI. Head: Atraumatic. ENT:      Ears:      Nose: No congestion/rhinnorhea.      Mouth/Throat: Mucous membranes are moist.  Neck: No stridor.   Cardiovascular: Normal rate, regular rhythm.  Good peripheral circulation. Respiratory: Normal respiratory effort without tachypnea or retractions. Lungs CTAB. Good air entry to the bases with no decreased or absent breath sounds. Gastrointestinal: Bowel sounds 4 quadrants. Soft and nontender to palpation. No guarding or rigidity. No palpable masses. No distention.  Musculoskeletal: Full range of motion to all extremities. No gross deformities appreciated.  Tenderness to palpation to right lumbar paraspinal muscles.  No pinpoint tenderness to palpation to lumbar spine.  Strength equal in lower extremities bilaterally.   Positive straight leg raise.  Full range of motion of toes. Neurologic:  Normal speech and language. No gross focal neurologic deficits are appreciated.  Skin:  Skin is warm, dry and intact. No rash noted. Psychiatric: Mood and affect are normal. Speech and behavior are normal. Patient exhibits appropriate insight and judgement.   ____________________________________________   LABS (all labs ordered are listed, but only abnormal results are displayed)  Labs Reviewed - No data to display ____________________________________________  EKG   ____________________________________________  RADIOLOGY Robinette Haines, personally viewed and evaluated these images (plain radiographs) as part of my medical decision making, as well as reviewing the written report by the radiologist.  Dg Lumbar Spine 2-3 Views  Result Date: 03/27/2019 CLINICAL DATA:  53 year old female with a history of back injury EXAM: LUMBAR SPINE - 2-3 VIEW COMPARISON:  04/19/2018 FINDINGS: Lumbar Spine: Similar degree of anterolisthesis of L4 on L5, approximately 4-5 mm. Redemonstration of what is a presumed S1 level that is partially lumbarized. No acute fracture line identified. Vertebral body heights maintained. Disc space narrowing similar to the prior, greatest in the lower thoracic region, and within the lumbar region at L5-S1. Facet hypertrophy at L4-L5 and L5-S1. Unremarkable appearance of the visualized abdomen. IMPRESSION: Negative for acute fracture or malalignment of the lumbar spine. Similar degree of anterolisthesis of L4 on L5. Facet hypertrophy worst at L4-L5 and L5-S1. Similar appearance of degenerative disc disease. Electronically Signed   By: Corrie Mckusick D.O.   On: 03/27/2019 10:39    ____________________________________________    PROCEDURES  Procedure(s) performed:    Procedures    Medications  ketorolac (TORADOL) 30 MG/ML injection 30 mg (30 mg Intramuscular Given 03/27/19 1144)      ____________________________________________   INITIAL IMPRESSION / ASSESSMENT AND PLAN / ED COURSE  Pertinent labs & imaging results that were available during my care of the patient were reviewed by me and considered in my medical decision making (see chart for details).  Review of the [place] CSRS was performed in accordance of the [place] prior to dispensing any controlled drugs.   Patient presented to the emergency department for evaluation of acute on chronic back pain.  Vital signs and exam are reassuring.  X-rays consistent with chronic findings.  Pain improved after Norflex and Toradol.  Patient was given referrals to orthopedics and pain management.  She was also given information about [facility] charity care.  Patient will be discharged home with prescriptions for toradol and baclofen.  Patient is to follow up with PCP and ortho as directed. Multiple referrals were given.  Info about [place] [facility] was given.  Patient is given ED precautions to return to the ED for any worsening or new symptoms.     ____________________________________________  FINAL CLINICAL IMPRESSION(S) / ED DIAGNOSES  Final diagnoses:  Acute right-sided low back pain with right-sided sciatica      NEW MEDICATIONS STARTED DURING THIS VISIT:  ED Discharge Orders         Ordered    baclofen (LIORESAL) 10 MG tablet  3 times daily,   Status:  Discontinued     03/27/19 1134    ketorolac (TORADOL) 10 MG tablet  Every 8 hours PRN,   Status:  Discontinued     03/27/19 1134    lidocaine (LIDODERM) 5 %  Every 12 hours,   Status:  Discontinued     03/27/19 1134    baclofen (LIORESAL) 10 MG tablet  3 times daily     03/27/19 1136    ketorolac (TORADOL) 10 MG tablet  Every 8 hours PRN     03/27/19 1136    lidocaine (LIDODERM) 5 %  Every 12 hours     03/27/19 1136              This chart was dictated using voice recognition software/Dragon. Despite best efforts to proofread, errors can occur which can  change the meaning. Any change was purely unintentional.    Laban Emperor, PA-C 03/27/19 1540    Merlyn Lot, MD 03/27/19 [phone]

## 2019-03-27 NOTE — ED Notes (Signed)
Pt states hx of chronic low back pain, was told by [facility] she needed to go to pain management clinic. Pt states today at work was lifting bag of sugar with sudden onset worse lower R sided back pain that radiates down posterior R leg. Pt states pain worse with movement and walking. Pt ambulatory with slight limp noted by this RN. Pt states to this RN and Musician that she does not want to file worker's comp after being indecisive with this RN and Theadora Rama, Therapist, sports. Final decision at this time is that patient does not wish to file worker's comp as stated to this RN and Theadora Rama, Therapist, sports.

## 2019-03-27 NOTE — ED Notes (Signed)

## 2019-03-27 NOTE — ED Triage Notes (Signed)
Pt states that she bent over to pick up a bag of sugar and started having sudden pain, in the center of her back that radiates down her spine into her right leg with burning

## 2019-03-29 ENCOUNTER — Emergency Department
Admission: EM | Admit: 2019-03-29 | Discharge: 2019-03-29 | Disposition: A | Discharge status: Home / Self Care | Payer: Self-pay | Attending: Emergency Medicine | Admitting: Emergency Medicine

## 2019-03-29 ENCOUNTER — Other Ambulatory Visit: Payer: Self-pay

## 2019-03-29 DIAGNOSIS — H5710 Ocular pain, unspecified eye: Secondary | ICD-10-CM | POA: Insufficient documentation

## 2019-03-29 DIAGNOSIS — M549 Dorsalgia, unspecified: Secondary | ICD-10-CM | POA: Insufficient documentation

## 2019-03-29 DIAGNOSIS — Z5321 Procedure and treatment not carried out due to patient leaving prior to being seen by health care provider: Secondary | ICD-10-CM | POA: Insufficient documentation

## 2019-03-29 DIAGNOSIS — R2243 Localized swelling, mass and lump, lower limb, bilateral: Secondary | ICD-10-CM | POA: Insufficient documentation

## 2019-03-29 NOTE — ED Triage Notes (Addendum)
Pt comes via POV from home with c/o right sided leg swelling. Pt states pain in her back. Pt also states some swelling in her left leg and eye pain.  Pt states left shoulder pain. Pt states her back is worse than anything.

## 2019-04-12 ENCOUNTER — Ambulatory Visit: Payer: Self-pay | Admitting: Podiatry

## 2019-04-21 ENCOUNTER — Ambulatory Visit: Payer: Self-pay | Admitting: Student in an Organized Health Care Education/Training Program

## 2019-04-26 ENCOUNTER — Ambulatory Visit: Payer: Self-pay | Admitting: Gastroenterology

## 2019-05-03 ENCOUNTER — Emergency Department
Admission: EM | Admit: 2019-05-03 | Discharge: 2019-05-03 | Disposition: A | Discharge status: Home / Self Care | Payer: Medicaid Other | Attending: Emergency Medicine | Admitting: Emergency Medicine

## 2019-05-03 ENCOUNTER — Other Ambulatory Visit: Payer: Self-pay

## 2019-05-03 ENCOUNTER — Encounter: Payer: Self-pay | Admitting: Emergency Medicine

## 2019-05-03 DIAGNOSIS — R112 Nausea with vomiting, unspecified: Secondary | ICD-10-CM | POA: Diagnosis not present

## 2019-05-03 DIAGNOSIS — Z20828 Contact with and (suspected) exposure to other viral communicable diseases: Secondary | ICD-10-CM | POA: Insufficient documentation

## 2019-05-03 DIAGNOSIS — R51 Headache: Secondary | ICD-10-CM | POA: Insufficient documentation

## 2019-05-03 DIAGNOSIS — R519 Headache, unspecified: Secondary | ICD-10-CM

## 2019-05-03 DIAGNOSIS — R109 Unspecified abdominal pain: Secondary | ICD-10-CM | POA: Diagnosis not present

## 2019-05-03 DIAGNOSIS — Z79899 Other long term (current) drug therapy: Secondary | ICD-10-CM | POA: Diagnosis not present

## 2019-05-03 DIAGNOSIS — Z96651 Presence of right artificial knee joint: Secondary | ICD-10-CM | POA: Diagnosis not present

## 2019-05-03 DIAGNOSIS — R197 Diarrhea, unspecified: Secondary | ICD-10-CM | POA: Insufficient documentation

## 2019-05-03 DIAGNOSIS — Z96652 Presence of left artificial knee joint: Secondary | ICD-10-CM | POA: Diagnosis not present

## 2019-05-03 DIAGNOSIS — F17210 Nicotine dependence, cigarettes, uncomplicated: Secondary | ICD-10-CM | POA: Diagnosis not present

## 2019-05-03 LAB — URINALYSIS, COMPLETE (UACMP) WITH MICROSCOPIC
Bacteria, UA: NONE SEEN
Bilirubin Urine: NEGATIVE
Glucose, UA: NEGATIVE mg/dL
Hgb urine dipstick: NEGATIVE
Ketones, ur: NEGATIVE mg/dL
Leukocytes,Ua: NEGATIVE
Nitrite: NEGATIVE
Protein, ur: NEGATIVE mg/dL
Specific Gravity, Urine: 1.008 (ref 1.005–1.030)
pH: 6 (ref 5.0–8.0)

## 2019-05-03 LAB — COMPREHENSIVE METABOLIC PANEL
ALT: 23 U/L (ref 0–44)
AST: 31 U/L (ref 15–41)
Albumin: 3.8 g/dL (ref 3.5–5.0)
Alkaline Phosphatase: 104 U/L (ref 38–126)
Anion gap: 7 (ref 5–15)
BUN: 11 mg/dL (ref 6–20)
CO2: 23 mmol/L (ref 22–32)
Calcium: 8.8 mg/dL — ABNORMAL LOW (ref 8.9–10.3)
Chloride: 108 mmol/L (ref 98–111)
Creatinine, Ser: 0.55 mg/dL (ref 0.44–1.00)
GFR calc Af Amer: >60 mL/min (ref >60)
GFR calc non Af Amer: >60 mL/min (ref >60)
Glucose, Bld: 117 mg/dL — ABNORMAL HIGH (ref 70–99)
Potassium: 4 mmol/L (ref 3.5–5.1)
Sodium: 138 mmol/L (ref 135–145)
Total Bilirubin: 0.7 mg/dL (ref 0.3–1.2)
Total Protein: 7 g/dL (ref 6.5–8.1)

## 2019-05-03 LAB — CBC
HCT: 42.7 % (ref 36.0–46.0)
Hemoglobin: 14.3 g/dL (ref 12.0–15.0)
MCH: 30.5 pg (ref 26.0–34.0)
MCHC: 33.5 g/dL (ref 30.0–36.0)
MCV: 91 fL (ref 80.0–100.0)
Platelets: 218 10*3/uL (ref 150–400)
RBC: 4.69 MIL/uL (ref 3.87–5.11)
RDW: 12.2 % (ref 11.5–15.5)
WBC: 7.4 10*3/uL (ref 4.0–10.5)
nRBC: 0 % (ref 0.0–0.2)

## 2019-05-03 LAB — SARS CORONAVIRUS 2 BY RT PCR (HOSPITAL ORDER, PERFORMED IN ~~LOC~~ HOSPITAL LAB): SARS Coronavirus 2: NEGATIVE

## 2019-05-03 LAB — LIPASE, BLOOD: Lipase: 18 U/L (ref 11–51)

## 2019-05-03 MED ORDER — ONDANSETRON HCL 4 MG/2ML IJ SOLN
4.0000 mg | Freq: Once | INTRAMUSCULAR | Status: AC | PRN
  Administered 2019-05-03: 16:00:00 4 mg via INTRAVENOUS
  Filled 2019-05-03: qty 2

## 2019-05-03 MED ORDER — SODIUM CHLORIDE 0.9 % IV BOLUS
1000.0000 mL | Freq: Once | INTRAVENOUS | Status: AC
  Administered 2019-05-03: 1000 mL via INTRAVENOUS

## 2019-05-03 MED ORDER — ONDANSETRON HCL 4 MG PO TABS: 4.0000 mg | ORAL_TABLET | Freq: Three times a day (TID) | ORAL | 0 refills | Status: DC | PRN

## 2019-05-03 MED ORDER — PROMETHAZINE HCL 25 MG/ML IJ SOLN
12.5000 mg | Freq: Once | INTRAMUSCULAR | Status: AC
  Administered 2019-05-03: 16:00:00 12.5 mg via INTRAVENOUS
  Filled 2019-05-03: qty 1

## 2019-05-03 MED ORDER — FENTANYL CITRATE (PF) 100 MCG/2ML IJ SOLN
50.0000 ug | Freq: Once | INTRAMUSCULAR | Status: AC
  Administered 2019-05-03: 16:00:00 50 ug via INTRAVENOUS
  Filled 2019-05-03: qty 2

## 2019-05-03 NOTE — Discharge Instructions (Addendum)
return to the emergency room for any new or worrisome symptoms, getting abdominal pain worsening headache numbness weakness persistent vomiting or other concerns

## 2019-05-03 NOTE — ED Provider Notes (Addendum)
[facility] Emergency Department Provider Note  ____________________________________________   I have reviewed the triage vital signs and the nursing notes. Where available I have reviewed prior notes and, if possible and indicated, outside hospital notes.   Patient seen and evaluated during the coronavirus epidemic during a time with low staffing  Patient seen for the symptoms described in the history of present illness. She was evaluated in the context of the global COVID-19 pandemic, which necessitated consideration that the patient might be at risk for infection with the SARS-CoV-2 virus that causes COVID-19. Institutional protocols and algorithms that pertain to the evaluation of patients at risk for COVID-19 are in a state of rapid change based on information released by regulatory bodies including the CDC and federal and state organizations. These policies and algorithms were followed during the patient's care in the ED.    HISTORY  Chief Complaint Emesis, Diarrhea, and Headache    HPI Monique Schaefer is a 53 y.o. female history of ADD depression fibromyalgia schizophrenia scoliosis of cervical lymphadenopathy, chronic pain disorder, cocaine abuse cannabis homelessness among other medical problems listed below, presents today complaining of nausea vomiting and diarrhea for since yesterday.  No blood in her stool, no melena no hematemesis.  She also has a slight headache.  Also her abdomen is crampy which is relieved with the diarrhea.  Is watery.  No recent travel no stiff neck no fever, no focal numbness or weakness, chronic back pain is also bothering her.  Headache has not atypical, gradual in onset not worst headache of life.  Past Medical History:  Diagnosis Date  . ADD (attention deficit disorder)   . Anxiety   . Depression   . Fibromyalgia   . GERD (gastroesophageal reflux disease)   . Neuropathy   . Schizophrenia ([place])   . Scoliosis     Patient  Active Problem List   Diagnosis Date Noted  . Cervical lymphadenopathy 06/08/2018  . Chronic active hepatitis ([place]) 06/08/2018  . Bilateral foot pain 05/04/2018  . Neuropathy 05/04/2018  . Elevated TSH 05/04/2018  . Exposure to hepatitis C 05/04/2018  . Back pain 03/04/2018  . Health care maintenance 03/04/2018  . 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 Cocaine abuse ([place]) 02/21/2018  . Cannabis abuse 02/21/2018  . Homelessness 02/21/2018    Past Surgical History:  Procedure Laterality Date  . BILATERAL CARPAL TUNNEL RELEASE Bilateral   . CHOLECYSTECTOMY    . FEMUR SURGERY Left   . JOINT REPLACEMENT    . REPLACEMENT TOTAL KNEE BILATERAL Bilateral     Prior to Admission medications   Medication Sig Start Date End Date Taking? Authorizing Provider  baclofen (LIORESAL) 10 MG tablet Take 0.5 tablets (5 mg total) by mouth 3 (three) times daily. 03/27/19 03/26/20  Laban Emperor, PA-C  buprenorphine-naloxone (SUBOXONE) 8-2 mg SUBL SL tablet Place 1 tablet under the tongue daily.    [provider]  clotrimazole-betamethasone (LOTRISONE) cream Apply 1 application topically 2 (two) times daily. 03/08/19   Fisher, Linden Dolin, PA-C  gabapentin (NEURONTIN) 400 MG capsule Take 1 capsule (400 mg total) by mouth 3 (three) times daily. 01/04/19   Iloabachie, Chioma E, NP  hydrOXYzine (ATARAX/VISTARIL) 10 MG tablet Take 1 tablet (10 mg total) by mouth 3 (three) times daily as needed. 03/08/19   Fisher, Linden Dolin, PA-C  lidocaine (LIDODERM) 5 % Place 1 patch onto the skin every 12 (twelve) hours. Remove & Discard patch within 12 hours or  as directed by MD 03/27/19 03/26/20  Laban Emperor, PA-C  omeprazole (PRILOSEC) 20 MG capsule TAKE ONE CAPSULE BY MOUTH EVERY DAY 11/18/18   Iloabachie, Chioma E, NP  oxybutynin (DITROPAN-XL) 10 MG 24 hr tablet Take 1 tablet (10 mg total) by mouth at bedtime. 12/14/18   Iloabachie, Chioma E, NP  predniSONE (STERAPRED UNI-PAK 21  TAB) 10 MG (21) TBPK tablet Take 6 pills on day one then decrease by 1 pill each day 03/08/19   Versie Starks, PA-C  QUEtiapine (SEROQUEL) 100 MG tablet Take 100 mg by mouth at bedtime.    [provider]  sertraline (ZOLOFT) 50 MG tablet Take 50 mg by mouth daily.    [provider]    Allergies Paxil [paroxetine hcl], Mango flavor, Morphine and related, Tramadol, Trazodone and nefazodone, and Oxycodone-acetaminophen  No family history on file.  Social History Social History   Tobacco Use  . Smoking status: Current Every Day Smoker    Packs/day: 0.50    Years: 15.00    Pack years: 7.50  . Smokeless tobacco: Never Used  . Tobacco comment: patient has materials already  Substance Use Topics  . Alcohol use: Not Currently    Frequency: Never  . Drug use: Not Currently    Types: Marijuana    Review of Systems Constitutional: No fever/chills Eyes: No visual changes. ENT: No sore throat. No stiff neck no neck pain Cardiovascular: Denies chest pain. Respiratory: Denies shortness of breath. Gastrointestinal:   no vomiting.  No diarrhea.  No constipation. Genitourinary: Negative for dysuria. Musculoskeletal: Negative lower extremity swelling Skin: Negative for rash. Neurological: Negative for severe headaches, focal weakness or numbness.   ____________________________________________   PHYSICAL EXAM:  VITAL SIGNS: ED Triage Vitals  Enc Vitals Group     BP 05/03/19 1455 140/90     Pulse Rate 05/03/19 1455 73     Resp 05/03/19 1455 18     Temp 05/03/19 1455 98.4 F (36.9 C)     Temp Source 05/03/19 1455 Oral     SpO2 05/03/19 1455 96 %     Weight 05/03/19 1456 200 lb (90.7 kg)     Height 05/03/19 1456 5\' 7"  (1.702 m)     Head Circumference --      Peak Flow --      Pain Score 05/03/19 1455 7     Pain Loc --      Pain Edu? --      Excl. in [place]? --     Constitutional: Alert and oriented. Well appearing and in no acute distress. Eyes: Conjunctivae  are normal Head: Atraumatic HEENT: No congestion/rhinnorhea. Mucous membranes are moist.  Oropharynx non-erythematous Neck:   Nontender with no meningismus, no masses, no stridor Cardiovascular: Normal rate, regular rhythm. Grossly normal heart sounds.  Good peripheral circulation. Respiratory: Normal respiratory effort.  No retractions. Lungs CTAB. Abdominal: Soft and nontender. No distention. No guarding no rebound Back:  There is no focal tenderness or step off.  there is no midline tenderness there are no lesions noted. there is no CVA tenderness Musculoskeletal: No lower extremity tenderness, no upper extremity tenderness. No joint effusions, no DVT signs strong distal pulses no edema Neurologic:  Normal speech and language. No gross focal neurologic deficits are appreciated.  Skin:  Skin is warm, dry and intact. No rash noted. Psychiatric: Mood and affect are normal. Speech and behavior are normal.  ____________________________________________   LABS (all labs ordered are listed, but only abnormal results are  displayed)  Labs Reviewed  COMPREHENSIVE METABOLIC PANEL - Abnormal; Notable for the following components:      Result Value   Glucose, Bld 117 (*)    Calcium 8.8 (*)    All other components within normal limits  SARS CORONAVIRUS 2 (HOSPITAL ORDER, PERFORMED IN [place] [facility])  LIPASE, BLOOD  CBC  URINALYSIS, COMPLETE (UACMP) WITH MICROSCOPIC    Pertinent labs  results that were available during my care of the patient were reviewed by me and considered in my medical decision making (see chart for details). ____________________________________________  EKG  I personally interpreted any EKGs ordered by me or triage  ____________________________________________  RADIOLOGY  Pertinent labs & imaging results that were available during my care of the patient were reviewed by me and considered in my medical decision making (see chart for details). If possible,  patient and/or family made aware of any abnormal findings.  No results found. ____________________________________________    PROCEDURES  Procedure(s) performed: None  Procedures  Critical Care performed: None  ____________________________________________   INITIAL IMPRESSION / ASSESSMENT AND PLAN / ED COURSE  Pertinent labs & imaging results that were available during my care of the patient were reviewed by me and considered in my medical decision making (see chart for details).   [place] well-appearing woman with nausea vomiting and diarrhea, headache, abdominal cramps, back pain, multiple other complaints.  Her abdomen is benign and nonsurgical on serial exams.  We are giving her IV fluids as she feels somewhat dehydrated, will check coronavirus as I can cause similar symptoms, I do not think there is any indication of a CT scan of the head at this time or abdomen.  Her abdomen is benign and her neurologic exam is normal and she has a mild headache.  Nothing to suggest bleed or meningitis.  We will continue however to observe her.  White count blood work thus far reassuring.  Patient not yet given [place] a urine sample.  ----------------------------------------- 7:24 PM on 05/03/2019 -----------------------------------------  Work-up quite unremarkable here patient feels under percent better we will try p.o. challenge abdomen still remains benign headache is gone.    ____________________________________________   FINAL CLINICAL IMPRESSION(S) / ED DIAGNOSES  Final diagnoses:  None      This chart was dictated using voice recognition software.  Despite best efforts to proofread,  errors can occur which can change meaning.      Schuyler Amor, MD 05/03/19 1720    Schuyler Amor, MD 05/03/19 [phone]

## 2019-05-03 NOTE — ED Triage Notes (Signed)
PT arrives with complaints of n/v/d and headache that started 2 days prior. Pt also reports lower left and right abdominal pain that feels like cramping.

## 2019-06-02 ENCOUNTER — Encounter: Payer: Self-pay | Admitting: Emergency Medicine

## 2019-06-02 ENCOUNTER — Emergency Department
Admission: EM | Admit: 2019-06-02 | Discharge: 2019-06-02 | Disposition: A | Discharge status: Home / Self Care | Payer: Medicaid Other | Attending: Emergency Medicine | Admitting: Emergency Medicine

## 2019-06-02 ENCOUNTER — Other Ambulatory Visit: Payer: Self-pay

## 2019-06-02 DIAGNOSIS — F1210 Cannabis abuse, uncomplicated: Secondary | ICD-10-CM | POA: Diagnosis not present

## 2019-06-02 DIAGNOSIS — Z96651 Presence of right artificial knee joint: Secondary | ICD-10-CM | POA: Diagnosis not present

## 2019-06-02 DIAGNOSIS — Z79899 Other long term (current) drug therapy: Secondary | ICD-10-CM | POA: Diagnosis not present

## 2019-06-02 DIAGNOSIS — M25512 Pain in left shoulder: Secondary | ICD-10-CM | POA: Insufficient documentation

## 2019-06-02 DIAGNOSIS — F329 Major depressive disorder, single episode, unspecified: Secondary | ICD-10-CM | POA: Insufficient documentation

## 2019-06-02 DIAGNOSIS — M545 Low back pain: Secondary | ICD-10-CM | POA: Insufficient documentation

## 2019-06-02 DIAGNOSIS — F17210 Nicotine dependence, cigarettes, uncomplicated: Secondary | ICD-10-CM | POA: Insufficient documentation

## 2019-06-02 DIAGNOSIS — Z96652 Presence of left artificial knee joint: Secondary | ICD-10-CM | POA: Insufficient documentation

## 2019-06-02 DIAGNOSIS — M25552 Pain in left hip: Secondary | ICD-10-CM

## 2019-06-02 MED ORDER — CYCLOBENZAPRINE HCL 5 MG PO TABS: ORAL_TABLET | ORAL | 0 refills | Status: DC

## 2019-06-02 MED ORDER — KETOROLAC TROMETHAMINE 30 MG/ML IJ SOLN
30.0000 mg | Freq: Once | INTRAMUSCULAR | Status: AC
  Administered 2019-06-02: 16:00:00 30 mg via INTRAMUSCULAR
  Filled 2019-06-02: qty 1

## 2019-06-02 MED ORDER — KETOROLAC TROMETHAMINE 10 MG PO TABS: 10.0000 mg | ORAL_TABLET | Freq: Four times a day (QID) | ORAL | 0 refills | Status: DC | PRN

## 2019-06-02 MED ORDER — ORPHENADRINE CITRATE 30 MG/ML IJ SOLN
60.0000 mg | Freq: Two times a day (BID) | INTRAMUSCULAR | Status: DC
  Administered 2019-06-02: 60 mg via INTRAMUSCULAR
  Filled 2019-06-02: qty 2

## 2019-06-02 NOTE — ED Notes (Signed)
Patient declined discharge vital signs due to needing to catch the bus.

## 2019-06-02 NOTE — ED Notes (Signed)
Pt tearful in triage states she can't deal with the pain anymore. Denies SI/HI. States all of her recourses have been cut off ([facility] and [facility])

## 2019-06-02 NOTE — ED Notes (Signed)
Patient is tearful, states she is depressed.

## 2019-06-02 NOTE — ED Triage Notes (Signed)
Pt c/o back, LFT arm and LFT leg pain. PT states hx of sciatica and back problems. States pain in chronic but worsened the past day. PT ambulatory, VSS

## 2019-06-02 NOTE — ED Triage Notes (Signed)
Says for 2 days has pain left side of body arm and leg and she lift the left shoulder.  She says she has back problems, but usually affect her right side.

## 2019-06-02 NOTE — ED Provider Notes (Signed)
[facility] Emergency Department Provider Note  ____________________________________________  Time seen: Approximately 4:42 PM  I have reviewed the triage vital signs and the nursing notes.   HISTORY  Chief Complaint No chief complaint on file.    HPI Monique Schaefer is a 53 y.o. female that presents to the emergency department for evaluation of chronic left shoulder and left low back pain.  Patient states that her left shoulder will pop when she moves it.  Patient states that usually pain on her right side is worse but recently, pain on her left side has been worse.  No trauma.  No bowel or bladder dysfunction or saddle anesthesias.  [facility] nothing else they could do for her.  She was also discharged from pain management.  He is waiting from "the court" to decide if she will get insurance.  She sees [facility].  She also states that pain is making her depression worsen.  No suicidal or homicidal ideations.    Past Medical History:  Diagnosis Date  . ADD (attention deficit disorder)   . Anxiety   . Depression   . Fibromyalgia   . GERD (gastroesophageal reflux disease)   . Neuropathy   . Schizophrenia ([place])   . Scoliosis     Patient Active Problem List   Diagnosis Date Noted  . Cervical lymphadenopathy 06/08/2018  . Chronic active hepatitis ([place]) 06/08/2018  . Bilateral foot pain 05/04/2018  . Neuropathy 05/04/2018  . Elevated TSH 05/04/2018  . Exposure to hepatitis C 05/04/2018  . Back pain 03/04/2018  . Health care maintenance 03/04/2018  . 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 Cocaine abuse ([place]) 02/21/2018  . Cannabis abuse 02/21/2018  . Homelessness 02/21/2018    Past Surgical History:  Procedure Laterality Date  . BILATERAL CARPAL TUNNEL RELEASE Bilateral   . CHOLECYSTECTOMY    . FEMUR SURGERY Left   . JOINT REPLACEMENT    . REPLACEMENT TOTAL KNEE  BILATERAL Bilateral     Prior to Admission medications   Medication Sig Start Date End Date Taking? Authorizing Provider  baclofen (LIORESAL) 10 MG tablet Take 0.5 tablets (5 mg total) by mouth 3 (three) times daily. 03/27/19 03/26/20  Laban Emperor, PA-C  buprenorphine-naloxone (SUBOXONE) 8-2 mg SUBL SL tablet Place 1 tablet under the tongue daily.    [provider]  clotrimazole-betamethasone (LOTRISONE) cream Apply 1 application topically 2 (two) times daily. 03/08/19   Caryn Section Linden Dolin, PA-C  cyclobenzaprine (FLEXERIL) 5 MG tablet Take 1-2 tablets 3 times daily as needed 06/02/19   Laban Emperor, PA-C  gabapentin (NEURONTIN) 400 MG capsule Take 1 capsule (400 mg total) by mouth 3 (three) times daily. 01/04/19   Iloabachie, Chioma E, NP  hydrOXYzine (ATARAX/VISTARIL) 10 MG tablet Take 1 tablet (10 mg total) by mouth 3 (three) times daily as needed. 03/08/19   Fisher, Linden Dolin, PA-C  ketorolac (TORADOL) 10 MG tablet Take 1 tablet (10 mg total) by mouth every 6 (six) hours as needed. 06/02/19   Laban Emperor, PA-C  lidocaine (LIDODERM) 5 % Place 1 patch onto the skin every 12 (twelve) hours. Remove & Discard patch within 12 hours or as directed by MD 03/27/19 03/26/20  Laban Emperor, PA-C  omeprazole (PRILOSEC) 20 MG capsule TAKE ONE CAPSULE BY MOUTH EVERY DAY 11/18/18   Iloabachie, Chioma E, NP  ondansetron (ZOFRAN) 4 MG tablet Take 1 tablet (4 mg total) by mouth every 8 (  eight) hours as needed for nausea or vomiting. 05/03/19   Schuyler Amor, MD  oxybutynin (DITROPAN-XL) 10 MG 24 hr tablet Take 1 tablet (10 mg total) by mouth at bedtime. 12/14/18   Iloabachie, Chioma E, NP  QUEtiapine (SEROQUEL) 100 MG tablet Take 100 mg by mouth at bedtime.    [provider]  sertraline (ZOLOFT) 50 MG tablet Take 50 mg by mouth daily.    [provider]    Allergies Paxil [paroxetine hcl], Mango flavor, Morphine and related, Tramadol, Trazodone and nefazodone, and  Oxycodone-acetaminophen  No family history on file.  Social History Social History   Tobacco Use  . Smoking status: Current Every Day Smoker    Packs/day: 0.50    Years: 15.00    Pack years: 7.50  . Smokeless tobacco: Never Used  . Tobacco comment: patient has materials already  Substance Use Topics  . Alcohol use: Not Currently    Frequency: Never  . Drug use: Not Currently    Types: Marijuana     Review of Systems  Constitutional: No fever/chills Cardiovascular: No chest pain. Respiratory: No SOB. Gastrointestinal: No nausea, no vomiting.  Musculoskeletal: Positive for shoulder and back pain. Skin: Negative for rash, abrasions, lacerations, ecchymosis. Neurological: Negative for headaches, numbness or tingling   ____________________________________________   PHYSICAL EXAM:  VITAL SIGNS: ED Triage Vitals  Enc Vitals Group     BP 06/02/19 1333 (!) 122/93     Pulse Rate 06/02/19 1333 (!) 103     Resp 06/02/19 1333 20     Temp 06/02/19 1333 98.5 F (36.9 C)     Temp Source 06/02/19 1333 Oral     SpO2 06/02/19 1333 97 %     Weight --      Height --      Head Circumference --      Peak Flow --      Pain Score 06/02/19 1334 10     Pain Loc --      Pain Edu? --      Excl. in [place]? --      Constitutional: Alert and oriented. Well appearing and in no acute distress. Eyes: Conjunctivae are normal. PERRL. EOMI. Head: Atraumatic. ENT:      Ears:      Nose: No congestion/rhinnorhea.      Mouth/Throat: Mucous membranes are moist.  Neck: No stridor.  No cervical spine tenderness to palpation. Cardiovascular: Normal rate, regular rhythm.  Good peripheral circulation. Respiratory: Normal respiratory effort without tachypnea or retractions. Lungs CTAB. Good air entry to the bases with no decreased or absent breath sounds. Musculoskeletal: Full range of motion to all extremities. No gross deformities appreciated.  Full range of motion of left shoulder and left hip.   Normal gait.  Strength equal in upper and lower extremities bilaterally. Neurologic:  Normal speech and language. No gross focal neurologic deficits are appreciated.  Skin:  Skin is warm, dry and intact. No rash noted. Psychiatric: Mood and affect are normal. Speech and behavior are normal. Patient exhibits appropriate insight and judgement.   ____________________________________________   LABS (all labs ordered are listed, but only abnormal results are displayed)  Labs Reviewed - No data to display ____________________________________________  EKG   ____________________________________________  RADIOLOGY  No results found.  ____________________________________________    PROCEDURES  Procedure(s) performed:    Procedures    Medications  orphenadrine (NORFLEX) injection 60 mg (60 mg Intramuscular Given 06/02/19 1602)  ketorolac (TORADOL) 30 MG/ML injection 30 mg (  30 mg Intramuscular Given 06/02/19 1559)     ____________________________________________   INITIAL IMPRESSION / ASSESSMENT AND PLAN / ED COURSE  Pertinent labs & imaging results that were available during my care of the patient were reviewed by me and considered in my medical decision making (see chart for details).  Review of the [place] CSRS was performed in accordance of the [place] prior to dispensing any controlled drugs.   Patient presented to the emergency department for evaluation of chronic pain to her left shoulder and left low back.  Vital signs and exam are reassuring.  IM Toradol and IM Norflex were given for pain with improvement in symptoms.  Calvin with [facility] came to speak with the patient regarding her depression.  She has follow-up with [facility] on September 11.  She denies any suicidal or homicidal ideations.  Patient will be discharged home with prescriptions for Toradol and Flexeril. Patient is to follow up with primary care as directed. Patient is given ED precautions to return to the ED for any  worsening or new symptoms.   Tekira Tabacco was evaluated in Emergency Department on 06/02/2019 for the symptoms described in the history of present illness. She was evaluated in the context of the global COVID-19 pandemic, which necessitated consideration that the patient might be at risk for infection with the SARS-CoV-2 virus that causes COVID-19. Institutional protocols and algorithms that pertain to the evaluation of patients at risk for COVID-19 are in a state of rapid change based on information released by regulatory bodies including the CDC and federal and state organizations. These policies and algorithms were followed during the patient's care in the ED.  ____________________________________________  FINAL CLINICAL IMPRESSION(S) / ED DIAGNOSES  Final diagnoses:  Acute pain of left shoulder  Pain of left hip joint      NEW MEDICATIONS STARTED DURING THIS VISIT:  ED Discharge Orders         Ordered    ketorolac (TORADOL) 10 MG tablet  Every 6 hours PRN     06/02/19 1703    cyclobenzaprine (FLEXERIL) 5 MG tablet     06/02/19 1703              This chart was dictated using voice recognition software/Dragon. Despite best efforts to proofread, errors can occur which can change the meaning. Any change was purely unintentional.    Laban Emperor, PA-C 06/02/19 1812    Earleen Newport, MD 06/03/19 0730

## 2019-06-02 NOTE — BH Assessment (Signed)
Assessment Note  Monique Schaefer is an 53 y.o. female who presents to the ER due to complaints of ongoing pain. While in the ER patient reported her symptoms of her depression is increasing due to the pain. Patient states, her sleep had decreased, she having crying spells. She also feeling hopeless because her pain is affecting work, which is negatively affecting her finances.   While talking with the patient, she admits to being depressed and don't know why writer was asked to speak with her. She denied SI/HI and AV/H several times throughout the interview. She currently received outpatient treatment with [facility] and have an upcoming appointment with her psychiatrist 06/10/2019. She told ER staff her medications are not working and when Probation officer asked her about it, she states "it's okay but the pain is the problem. If I get this (pain) fixed, I know everything else will be okay. I'm depressed because I'm hurting. And no I don't want to kill myself. No I don't want to die, so stop asking me."   Writer discussed patient with PA, Laban Emperor, and she can be discharged when medically cleared.  Diagnosis: Depression  Past Medical History:  Past Medical History:  Diagnosis Date  . ADD (attention deficit disorder)   . Anxiety   . Depression   . Fibromyalgia   . GERD (gastroesophageal reflux disease)   . Neuropathy   . Schizophrenia ([place])   . Scoliosis     Past Surgical History:  Procedure Laterality Date  . BILATERAL CARPAL TUNNEL RELEASE Bilateral   . CHOLECYSTECTOMY    . FEMUR SURGERY Left   . JOINT REPLACEMENT    . REPLACEMENT TOTAL KNEE BILATERAL Bilateral     Family History: No family history on file.  Social History:  reports that she has been smoking. She has a 7.50 pack-year smoking history. She has never used smokeless tobacco. She reports previous alcohol use. She reports previous drug use. Drug: Marijuana.  Additional Social History:  Alcohol / Drug Use Pain Medications: See  PTA Prescriptions: See PTA Over the Counter: See PTA History of alcohol / drug use?: No history of alcohol / drug abuse Longest period of sobriety (when/how long): Reports of no use  CIWA: CIWA-Ar BP: (!) 122/93 Pulse Rate: (!) 103 COWS:    Allergies:  Allergies  Allergen Reactions  . Paxil [Paroxetine Hcl] Swelling  . Mango Flavor Hives  . Morphine And Related Itching  . Tramadol Itching  . Trazodone And Nefazodone   . Oxycodone-Acetaminophen Itching    Home Medications: (Not in a hospital admission)   OB/GYN Status:  No LMP recorded (lmp unknown). Patient is postmenopausal.  General Assessment Data Location of Assessment: [facility] ED TTS Assessment: In system Is this a Tele or Face-to-Face Assessment?: Face-to-Face Is this an Initial Assessment or a Re-assessment for this encounter?: Initial Assessment Patient Accompanied by:: N/A Language Other than English: No Living Arrangements: Other (Comment)(Private Home) What gender do you identify as?: Female Marital status: Single Pregnancy Status: No Living Arrangements: Alone Can pt return to current living arrangement?: Yes Admission Status: Voluntary Is patient capable of signing voluntary admission?: Yes Referral Source: Self/Family/Friend Insurance type: None  Medical Screening Exam ([place]) Medical Exam completed: Yes  Crisis Care Plan Living Arrangements: Alone Legal Guardian: Other:(Self) Name of Psychiatrist: [place] Name of Therapist: Reports of none  Education Status Is patient currently in school?: No Is the patient employed, unemployed or receiving disability?: Employed  Risk to self with the past 6 months Suicidal  Ideation: No Has patient been a risk to self within the past 6 months prior to admission? : No Suicidal Intent: No Has patient had any suicidal intent within the past 6 months prior to admission? : No Is patient at risk for suicide?: No Suicidal Plan?: No Has patient had any  suicidal plan within the past 6 months prior to admission? : No Access to Means: No What has been your use of drugs/alcohol within the last 12 months?: Reports of none Previous Attempts/Gestures: No How many times?: 0 Other Self Harm Risks: Reports of none Triggers for Past Attempts: None known Intentional Self Injurious Behavior: None Family Suicide History: No Recent stressful life event(s): Recent negative physical changes Persecutory voices/beliefs?: No Depression: Yes Depression Symptoms: Insomnia, Loss of interest in usual pleasures, Feeling worthless/self pity, Feeling angry/irritable(Due to her back pain) Substance abuse history and/or treatment for substance abuse?: No Suicide prevention information given to non-admitted patients: Not applicable  Risk to Others within the past 6 months Homicidal Ideation: No Does patient have any lifetime risk of violence toward others beyond the six months prior to admission? : No Thoughts of Harm to Others: No Current Homicidal Intent: No Current Homicidal Plan: No Access to Homicidal Means: No Identified Victim: Reports of none History of harm to others?: No Assessment of Violence: None Noted Violent Behavior Description: Reports of none Does patient have access to weapons?: No Criminal Charges Pending?: No Does patient have a court date: No Is patient on probation?: No  Psychosis Hallucinations: None noted Delusions: None noted  Mental Status Report Appearance/Hygiene: Unremarkable, In scrubs Eye Contact: Good Motor Activity: Unable to assess(Patient laying in the bed) Speech: Logical/coherent, Unremarkable, Soft Level of Consciousness: Alert Mood: Anxious, Sad, Pleasant Affect: Appropriate to circumstance, Sad Anxiety Level: Minimal Thought Processes: Coherent, Relevant Judgement: Unimpaired Orientation: Person, Place, Time, Situation, Appropriate for developmental age Obsessive Compulsive Thoughts/Behaviors:  Minimal  Cognitive Functioning Concentration: Normal Memory: Recent Intact, Remote Intact Is patient IDD: No Insight: Fair Impulse Control: Fair Appetite: Fair Have you had any weight changes? : No Change Sleep: Decreased Total Hours of Sleep: 5(Trouble falling and staying asleep) Vegetative Symptoms: None  ADLScreening [facility] Assessment Services) Patient's cognitive ability adequate to safely complete daily activities?: Yes Patient able to express need for assistance with ADLs?: Yes Independently performs ADLs?: Yes (appropriate for developmental age)  Prior Inpatient Therapy Prior Inpatient Therapy: No  Prior Outpatient Therapy Prior Outpatient Therapy: Yes Prior Therapy Dates: Current Prior Therapy Facilty/Provider(s): [facility] Reason for Treatment: Depression Does patient have an ACCT team?: No Does patient have Intensive In-House Services?  : No Does patient have [facility] services? : No Does patient have P4CC services?: No  ADL Screening (condition at time of admission) Patient's cognitive ability adequate to safely complete daily activities?: Yes Is the patient deaf or have difficulty hearing?: No Does the patient have difficulty seeing, even when wearing glasses/contacts?: No Does the patient have difficulty concentrating, remembering, or making decisions?: No Patient able to express need for assistance with ADLs?: Yes Does the patient have difficulty dressing or bathing?: No Independently performs ADLs?: Yes (appropriate for developmental age) Does the patient have difficulty walking or climbing stairs?: No Weakness of Legs: None Weakness of Arms/Hands: None  Home Assistive Devices/Equipment Home Assistive Devices/Equipment: None  Therapy Consults (therapy consults require a physician order) PT Evaluation Needed: No OT Evalulation Needed: No SLP Evaluation Needed: No Abuse/Neglect Assessment (Assessment to be complete while patient is alone) Abuse/Neglect  Assessment Can Be Completed: Yes Physical  Abuse: Denies Verbal Abuse: Denies Sexual Abuse: Denies Exploitation of patient/patient's resources: Denies Self-Neglect: Denies Values / Beliefs Cultural Requests During Hospitalization: None Spiritual Requests During Hospitalization: None Consults Spiritual Care Consult Needed: No Social Work Consult Needed: No Regulatory affairs officer (For Healthcare) Does Patient Have a Medical Advance Directive?: No       Child/Adolescent Assessment Running Away Risk: Denies(Patient is an adult)  Disposition:  Disposition Initial Assessment Completed for this Encounter: Yes  On Site Evaluation by:   Reviewed with Physician:    Gunnar Fusi MS, LCAS, [facility], [place] Therapeutic Triage Specialist 06/02/2019 9:57 PM

## 2019-06-02 NOTE — ED Notes (Signed)
TTS aware of patient.

## 2019-06-07 ENCOUNTER — Emergency Department
Admission: EM | Admit: 2019-06-07 | Discharge: 2019-06-07 | Disposition: A | Discharge status: Home / Self Care | Payer: Medicaid Other | Attending: Emergency Medicine | Admitting: Emergency Medicine

## 2019-06-07 ENCOUNTER — Other Ambulatory Visit: Payer: Self-pay

## 2019-06-07 ENCOUNTER — Encounter: Payer: Self-pay | Admitting: Emergency Medicine

## 2019-06-07 DIAGNOSIS — R200 Anesthesia of skin: Secondary | ICD-10-CM | POA: Diagnosis present

## 2019-06-07 DIAGNOSIS — Z79899 Other long term (current) drug therapy: Secondary | ICD-10-CM | POA: Insufficient documentation

## 2019-06-07 DIAGNOSIS — F17210 Nicotine dependence, cigarettes, uncomplicated: Secondary | ICD-10-CM | POA: Diagnosis not present

## 2019-06-07 DIAGNOSIS — M792 Neuralgia and neuritis, unspecified: Secondary | ICD-10-CM | POA: Diagnosis not present

## 2019-06-07 MED ORDER — PREDNISONE 10 MG (21) PO TBPK: ORAL_TABLET | ORAL | 0 refills | Status: DC

## 2019-06-07 NOTE — ED Triage Notes (Signed)
C/O left leg pain and numbness x 1 week, since receiving an injection at last visit.  AAOx3.  Skin warm and dry.  MAE equally and strong.  Ambulates with easy and steady gait.

## 2019-06-07 NOTE — ED Provider Notes (Addendum)
[facility] Emergency Department Provider Note  ____________________________________________   First MD Initiated Contact with Patient 06/07/19 1358     (approximate)  I have reviewed the triage vital signs and the nursing notes.   HISTORY  Chief Complaint Leg Problem   HPI Monique Schaefer is a 53 y.o. female who presents to the emergency department for treatment and evaluation of a numbness to her left leg after receiving an injection of Toradol at her last visit.  She states that immediately after the injection was given, she had the sensation of numbness.  Since that time every time she takes a step she feels sharp, "shockwaves" run through her leg and she is unable to walk.   She states that the medications that she was given for her pain that day has not helped with her current symptoms.   Past Medical History:  Diagnosis Date  . ADD (attention deficit disorder)   . Anxiety   . Depression   . Fibromyalgia   . GERD (gastroesophageal reflux disease)   . Neuropathy   . Schizophrenia ([place])   . Scoliosis     Patient Active Problem List   Diagnosis Date Noted  . Cervical lymphadenopathy 06/08/2018  . Chronic active hepatitis ([place]) 06/08/2018  . Bilateral foot pain 05/04/2018  . Neuropathy 05/04/2018  . Elevated TSH 05/04/2018  . Exposure to hepatitis C 05/04/2018  . Back pain 03/04/2018  . Health care maintenance 03/04/2018  . 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 Cocaine abuse ([place]) 02/21/2018  . Cannabis abuse 02/21/2018  . Homelessness 02/21/2018    Past Surgical History:  Procedure Laterality Date  . BILATERAL CARPAL TUNNEL RELEASE Bilateral   . CHOLECYSTECTOMY    . FEMUR SURGERY Left   . JOINT REPLACEMENT    . REPLACEMENT TOTAL KNEE BILATERAL Bilateral     Prior to Admission medications   Medication Sig Start Date End Date Taking? Authorizing Provider  baclofen (LIORESAL) 10 MG tablet  Take 0.5 tablets (5 mg total) by mouth 3 (three) times daily. 03/27/19 03/26/20  Laban Emperor, PA-C  buprenorphine-naloxone (SUBOXONE) 8-2 mg SUBL SL tablet Place 1 tablet under the tongue daily.    [provider]  clotrimazole-betamethasone (LOTRISONE) cream Apply 1 application topically 2 (two) times daily. 03/08/19   Caryn Section Linden Dolin, PA-C  cyclobenzaprine (FLEXERIL) 5 MG tablet Take 1-2 tablets 3 times daily as needed 06/02/19   Laban Emperor, PA-C  gabapentin (NEURONTIN) 400 MG capsule Take 1 capsule (400 mg total) by mouth 3 (three) times daily. 01/04/19   Iloabachie, Chioma E, NP  hydrOXYzine (ATARAX/VISTARIL) 10 MG tablet Take 1 tablet (10 mg total) by mouth 3 (three) times daily as needed. 03/08/19   Fisher, Linden Dolin, PA-C  ketorolac (TORADOL) 10 MG tablet Take 1 tablet (10 mg total) by mouth every 6 (six) hours as needed. 06/02/19   Laban Emperor, PA-C  lidocaine (LIDODERM) 5 % Place 1 patch onto the skin every 12 (twelve) hours. Remove & Discard patch within 12 hours or as directed by MD 03/27/19 03/26/20  Laban Emperor, PA-C  omeprazole (PRILOSEC) 20 MG capsule TAKE ONE CAPSULE BY MOUTH EVERY DAY 11/18/18   Iloabachie, Chioma E, NP  ondansetron (ZOFRAN) 4 MG tablet Take 1 tablet (4 mg total) by mouth every 8 (eight) hours as needed for nausea or vomiting. 05/03/19   Schuyler Amor, MD  oxybutynin (DITROPAN-XL) 10 MG 24 hr tablet Take 1 tablet (10 mg total) by  mouth at bedtime. 12/14/18   Iloabachie, Chioma E, NP  predniSONE (STERAPRED UNI-PAK 21 TAB) 10 MG (21) TBPK tablet Take 6 tablets on the first day and decrease by 1 tablet each day until finished. 06/07/19   Raihana Balderrama B, FNP  QUEtiapine (SEROQUEL) 100 MG tablet Take 100 mg by mouth at bedtime.    [provider]  sertraline (ZOLOFT) 50 MG tablet Take 50 mg by mouth daily.    [provider]    Allergies Paxil [paroxetine hcl], Mango flavor, Morphine and related, Tramadol, Trazodone and nefazodone, and  Oxycodone-acetaminophen  No family history on file.  Social History Social History   Tobacco Use  . Smoking status: Current Every Day Smoker    Packs/day: 0.50    Years: 15.00    Pack years: 7.50  . Smokeless tobacco: Never Used  . Tobacco comment: patient has materials already  Substance Use Topics  . Alcohol use: Not Currently    Frequency: Never  . Drug use: Not Currently    Types: Marijuana    Review of Systems  Constitutional: No fever/chills Eyes: No visual changes. ENT: No sore throat. Cardiovascular: Denies chest pain. Respiratory: Denies shortness of breath. Gastrointestinal: No abdominal pain.  No nausea, no vomiting.  No diarrhea.  No constipation. Genitourinary: Negative for dysuria. Musculoskeletal: Positive for chronic back pain. Skin: Negative for rash. Neurological: Positive for altered sensation in the vastus lateralis area of the left lower extremity. ____________________________________________   PHYSICAL EXAM:  VITAL SIGNS: ED Triage Vitals  Enc Vitals Group     BP 06/07/19 1342 105/75     Pulse Rate 06/07/19 1341 (!) 101     Resp 06/07/19 1341 16     Temp 06/07/19 1341 98.5 F (36.9 C)     Temp Source 06/07/19 1341 Oral     SpO2 06/07/19 1341 97 %     Weight 06/07/19 1340 199 lb 15.3 oz (90.7 kg)     Height 06/07/19 1340 5\' 7"  (1.702 m)     Head Circumference --      Peak Flow --      Pain Score 06/07/19 1339 7     Pain Loc --      Pain Edu? --      Excl. in [place]? --     Constitutional: Alert and oriented. Well appearing and in no acute distress. Eyes: Conjunctivae are normal. Head: Atraumatic. Nose: No congestion/rhinnorhea. Mouth/Throat: Mucous membranes are moist.  Oropharynx non-erythematous. Neck: No stridor.   Cardiovascular: Normal rate, regular rhythm. Grossly normal heart sounds.  Good peripheral circulation. Respiratory: Normal respiratory effort.  No retractions. Lungs CTAB. Gastrointestinal: Soft and nontender. No  distention. No abdominal bruits. No CVA tenderness. Musculoskeletal: No lower extremity tenderness nor edema.  No joint effusions. Neurologic:  Normal speech and language. No gross focal neurologic deficits are appreciated. No saddle anesthesia, loss of bowel or bladder control. Motor and sensory function of the left lower extremity is intact. Skin:  Skin is warm, dry and intact. No rash noted. Psychiatric: Mood and affect are normal. Speech and behavior are normal.  ____________________________________________   LABS (all labs ordered are listed, but only abnormal results are displayed)  Labs Reviewed - No data to display ____________________________________________  EKG  Not indicated ____________________________________________  RADIOLOGY  ED MD interpretation: Not indicated  Official radiology report(s): No results found.  ____________________________________________   PROCEDURES  Procedure(s) performed: None  Procedures  Critical Care performed: No  ____________________________________________   INITIAL IMPRESSION /  ASSESSMENT AND PLAN / ED COURSE     53 year old female presenting to the emergency department for treatment of a altered sensation in her left lower extremity after receiving an injection approximately 1 week ago.  She is already on gabapentin.  She will be given a tapered prednisone pack.  She was offered a referral to neurology but states that she does not have any insurance and that is not going to do her any good anyway.  I advised her that she needed to follow-up with [facility] if not improving.  She states that that cannot get either because they "cannot meet my needs."  Provided her with a list of community resources to establish a new primary care provider. ____________________________________________   FINAL CLINICAL IMPRESSION(S) / ED DIAGNOSES  Final diagnoses:  Neuropathic pain     ED Discharge Orders         Ordered     predniSONE (STERAPRED UNI-PAK 21 TAB) 10 MG (21) TBPK tablet     06/07/19 1419           Note:  This document was prepared using Dragon voice recognition software and may include unintentional dictation errors.    Victorino Dike, FNP 06/07/19 [place], [place], FNP 06/07/19 1544    Lavonia Drafts, MD 06/08/19 1235

## 2019-06-13 ENCOUNTER — Telehealth: Payer: Self-pay | Admitting: Emergency Medicine

## 2019-06-13 NOTE — Telephone Encounter (Signed)
Caller complaining of shooting pain radiating up and down since having a injection to left thigh " they had to of hit a nerve", original visit 06/02/19  during the visit received injection to left thigh , immediately felt a numbness, re-seen on 9/8 for increasing pain radiating up and down from injection site, with bruising and a knot.  "it feels like shock waves".   I encouraged patient to seek treatment again.

## 2019-06-15 ENCOUNTER — Encounter: Payer: Self-pay | Admitting: Emergency Medicine

## 2019-06-15 ENCOUNTER — Emergency Department
Admission: EM | Admit: 2019-06-15 | Discharge: 2019-06-15 | Disposition: A | Discharge status: Home / Self Care | Payer: Medicaid Other | Attending: Emergency Medicine | Admitting: Emergency Medicine

## 2019-06-15 ENCOUNTER — Other Ambulatory Visit: Payer: Self-pay

## 2019-06-15 DIAGNOSIS — M5440 Lumbago with sciatica, unspecified side: Secondary | ICD-10-CM | POA: Diagnosis not present

## 2019-06-15 DIAGNOSIS — F17200 Nicotine dependence, unspecified, uncomplicated: Secondary | ICD-10-CM | POA: Diagnosis not present

## 2019-06-15 DIAGNOSIS — Z79899 Other long term (current) drug therapy: Secondary | ICD-10-CM | POA: Insufficient documentation

## 2019-06-15 DIAGNOSIS — M79605 Pain in left leg: Secondary | ICD-10-CM | POA: Diagnosis present

## 2019-06-15 DIAGNOSIS — Z590 Homelessness: Secondary | ICD-10-CM | POA: Diagnosis not present

## 2019-06-15 DIAGNOSIS — G8929 Other chronic pain: Secondary | ICD-10-CM

## 2019-06-15 DIAGNOSIS — Z96653 Presence of artificial knee joint, bilateral: Secondary | ICD-10-CM | POA: Insufficient documentation

## 2019-06-15 MED ORDER — SODIUM CHLORIDE 0.9 % IV BOLUS
1000.0000 mL | Freq: Once | INTRAVENOUS | Status: AC
  Administered 2019-06-15: 1000 mL via INTRAVENOUS

## 2019-06-15 MED ORDER — ORPHENADRINE CITRATE 30 MG/ML IJ SOLN
60.0000 mg | Freq: Once | INTRAMUSCULAR | Status: AC
  Administered 2019-06-15: 60 mg via INTRAVENOUS
  Filled 2019-06-15: qty 2

## 2019-06-15 MED ORDER — PREDNISONE 10 MG (48) PO TBPK: ORAL_TABLET | ORAL | 0 refills | Status: DC

## 2019-06-15 MED ORDER — BACLOFEN 10 MG PO TABS: 10.0000 mg | ORAL_TABLET | Freq: Three times a day (TID) | ORAL | 0 refills | Status: AC

## 2019-06-15 MED ORDER — METHYLPREDNISOLONE SODIUM SUCC 125 MG IJ SOLR
125.0000 mg | Freq: Once | INTRAMUSCULAR | Status: AC
  Administered 2019-06-15: 125 mg via INTRAVENOUS
  Filled 2019-06-15: qty 2

## 2019-06-15 NOTE — ED Provider Notes (Signed)
[facility] Emergency Department Provider Note  ____________________________________________   First MD Initiated Contact with Patient 06/15/19 1226     (approximate)  I have reviewed the triage vital signs and the nursing notes.   HISTORY  Chief Complaint Leg Pain    HPI Monique Schaefer is a 53 y.o. female presents emergency department complaining of left leg pain and numbness.  She states is from a Toradol shot she was given here in the ED.  This is her second visit for the same.  She denies any fever or chills.  She has history of substance abuse, chronic pain, fibromyalgia etc.    Past Medical History:  Diagnosis Date  . ADD (attention deficit disorder)   . Anxiety   . Depression   . Fibromyalgia   . GERD (gastroesophageal reflux disease)   . Neuropathy   . Schizophrenia ([place])   . Scoliosis     Patient Active Problem List   Diagnosis Date Noted  . Cervical lymphadenopathy 06/08/2018  . Chronic active hepatitis ([place]) 06/08/2018  . Bilateral foot pain 05/04/2018  . Neuropathy 05/04/2018  . Elevated TSH 05/04/2018  . Exposure to hepatitis C 05/04/2018  . Back pain 03/04/2018  . Health care maintenance 03/04/2018  . 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 Cocaine abuse ([place]) 02/21/2018  . Cannabis abuse 02/21/2018  . Homelessness 02/21/2018    Past Surgical History:  Procedure Laterality Date  . BILATERAL CARPAL TUNNEL RELEASE Bilateral   . CHOLECYSTECTOMY    . FEMUR SURGERY Left   . JOINT REPLACEMENT    . REPLACEMENT TOTAL KNEE BILATERAL Bilateral     Prior to Admission medications   Medication Sig Start Date End Date Taking? Authorizing Provider  baclofen (LIORESAL) 10 MG tablet Take 1 tablet (10 mg total) by mouth 3 (three) times daily. 06/15/19 06/14/20  Caryn Section, Linden Dolin, PA-C  buprenorphine-naloxone (SUBOXONE) 8-2 mg SUBL SL tablet Place 1 tablet under the tongue daily.    [provider]  clotrimazole-betamethasone (LOTRISONE) cream Apply 1 application topically 2 (two) times daily. 03/08/19   Caryn Section Linden Dolin, PA-C  cyclobenzaprine (FLEXERIL) 5 MG tablet Take 1-2 tablets 3 times daily as needed 06/02/19   Laban Emperor, PA-C  gabapentin (NEURONTIN) 400 MG capsule Take 1 capsule (400 mg total) by mouth 3 (three) times daily. 01/04/19   Iloabachie, Chioma E, NP  hydrOXYzine (ATARAX/VISTARIL) 10 MG tablet Take 1 tablet (10 mg total) by mouth 3 (three) times daily as needed. 03/08/19   , Linden Dolin, PA-C  ketorolac (TORADOL) 10 MG tablet Take 1 tablet (10 mg total) by mouth every 6 (six) hours as needed. 06/02/19   Laban Emperor, PA-C  lidocaine (LIDODERM) 5 % Place 1 patch onto the skin every 12 (twelve) hours. Remove & Discard patch within 12 hours or as directed by MD 03/27/19 03/26/20  Laban Emperor, PA-C  omeprazole (PRILOSEC) 20 MG capsule TAKE ONE CAPSULE BY MOUTH EVERY DAY 11/18/18   Iloabachie, Chioma E, NP  ondansetron (ZOFRAN) 4 MG tablet Take 1 tablet (4 mg total) by mouth every 8 (eight) hours as needed for nausea or vomiting. 05/03/19   Schuyler Amor, MD  oxybutynin (DITROPAN-XL) 10 MG 24 hr tablet Take 1 tablet (10 mg total) by mouth at bedtime. 12/14/18   Iloabachie, Chioma E, NP  predniSONE (STERAPRED UNI-PAK 21 TAB) 10 MG (21) TBPK tablet Take 6 tablets on the first day and decrease by 1 tablet each day  until finished. 06/07/19   Triplett, Cari B, FNP  predniSONE (STERAPRED UNI-PAK 48 TAB) 10 MG (48) TBPK tablet Take 6 pills for 2 days, 5 pills x 2d, 4 pills x 2d, 3 pills x 2d, 2 pills x 2d, 1 pill x 2d 06/15/19   Caryn Section Linden Dolin, PA-C  QUEtiapine (SEROQUEL) 100 MG tablet Take 100 mg by mouth at bedtime.    [provider]  sertraline (ZOLOFT) 50 MG tablet Take 50 mg by mouth daily.    [provider]    Allergies Paxil [paroxetine hcl], Mango flavor, Morphine and related, Oxycodone-acetaminophen, Tramadol, and Trazodone and nefazodone  No  family history on file.  Social History Social History   Tobacco Use  . Smoking status: Current Every Day Smoker    Packs/day: 0.50    Years: 15.00    Pack years: 7.50  . Smokeless tobacco: Never Used  Substance Use Topics  . Alcohol use: Not Currently    Frequency: Never  . Drug use: Not Currently    Types: Marijuana    Review of Systems  Constitutional: No fever/chills Eyes: No visual changes. ENT: No sore throat. Respiratory: Denies cough Genitourinary: Negative for dysuria. Musculoskeletal: Positive for back pain. Skin: Negative for rash.    ____________________________________________   PHYSICAL EXAM:  VITAL SIGNS: ED Triage Vitals  Enc Vitals Group     BP 06/15/19 1213 124/81     Pulse Rate 06/15/19 1213 85     Resp 06/15/19 1213 20     Temp 06/15/19 1213 99 F (37.2 C)     Temp Source 06/15/19 1213 Oral     SpO2 06/15/19 1213 95 %     Weight 06/15/19 1213 200 lb (90.7 kg)     Height 06/15/19 1213 5\' 7"  (1.702 m)     Head Circumference --      Peak Flow --      Pain Score 06/15/19 1216 7     Pain Loc --      Pain Edu? --      Excl. in [place]? --     Constitutional: Alert and oriented. Well appearing and in no acute distress.  Patient is trying to act tearful but has no tears Eyes: Conjunctivae are normal.  Head: Atraumatic. Nose: No congestion/rhinnorhea. Mouth/Throat: Mucous membranes are moist.   Neck:  supple no lymphadenopathy noted Cardiovascular: Normal rate, regular rhythm. Heart sounds are normal Respiratory: Normal respiratory effort.  No retractions, lungs c t a  GU: deferred Musculoskeletal: FROM all extremities, warm and well perfused, 5/5 strength in lower extremities, lumbar spine is minimally tender Neurologic:  Normal speech and language.  Skin:  Skin is warm, dry and intact. No rash noted. Psychiatric: Mood and affect are normal. Speech and behavior are normal.  ____________________________________________   LABS (all labs  ordered are listed, but only abnormal results are displayed)  Labs Reviewed - No data to display ____________________________________________   ____________________________________________  RADIOLOGY    ____________________________________________   PROCEDURES  Procedure(s) performed: Solu-Medrol 125 mg IV, Norflex 60 mg IV   Procedures    ____________________________________________   INITIAL IMPRESSION / ASSESSMENT AND PLAN / ED COURSE  Pertinent labs & imaging results that were available during my care of the patient were reviewed by me and considered in my medical decision making (see chart for details).   Patient is 53 year old female presents emergency department for her third visit complaining of low back pain with radiation to the leg.  She states an  injection here was put into her nerve of her left leg which is caused problems.  Physical exam the patient's complaints do not match her demeanor.  Exam is basically unremarkable other than the lumbar tenderness and radiculopathy.  Patient was given Solu-Medrol 125 mg IV and Norflex 60 mg IV  She was given a prescription for Sterapred 12-day Dosepak, baclofen, and was instructed to follow-up with 1 of the discounted health services as she does not have insurance.  Explained to her that she most likely needs an MRI and should inquire about this from her primary care doctor or back doctor.  She was discharged in stable condition.   Monique Schaefer was evaluated in Emergency Department on 06/15/2019 for the symptoms described in the history of present illness. She was evaluated in the context of the global COVID-19 pandemic, which necessitated consideration that the patient might be at risk for infection with the SARS-CoV-2 virus that causes COVID-19. Institutional protocols and algorithms that pertain to the evaluation of patients at risk for COVID-19 are in a state of rapid change based on information released by regulatory  bodies including the CDC and federal and state organizations. These policies and algorithms were followed during the patient's care in the ED.   As part of my medical decision making, I reviewed the following data within the [facility] notes reviewed and incorporated, Old chart reviewed, Notes from prior ED visits and [place] Controlled Substance Database  ____________________________________________   FINAL CLINICAL IMPRESSION(S) / ED DIAGNOSES  Final diagnoses:  Chronic midline low back pain with sciatica, sciatica laterality unspecified      NEW MEDICATIONS STARTED DURING THIS VISIT:  Discharge Medication List as of 06/15/2019  1:30 PM    START taking these medications   Details  !! predniSONE (STERAPRED UNI-PAK 48 TAB) 10 MG (48) TBPK tablet Take 6 pills for 2 days, 5 pills x 2d, 4 pills x 2d, 3 pills x 2d, 2 pills x 2d, 1 pill x 2d, Print     !! - Potential duplicate medications found. Please discuss with provider.       Note:  This document was prepared using Dragon voice recognition software and may include unintentional dictation errors.    Versie Starks, PA-C 06/15/19 1345    Duffy Bruce, MD 06/15/19 [phone]

## 2019-06-15 NOTE — ED Triage Notes (Signed)
Pt in via POV, complaints of left leg pain and numbness; states, "I was given a toradol shot last time I was here and have had this numbness and pain every since.  I know she hit my nerve.  Ambulatory to triage, NAD noted at this time.

## 2019-06-15 NOTE — ED Notes (Signed)
Pt alert and oriented X 4, stable for discharge. RR even and unlabored, color WNL. Discussed discharge instructions and follow up when appropriate. Instructed to follow up with ER for any life threatening symptoms or concerns that patient or family of patient may have  

## 2019-06-15 NOTE — ED Notes (Signed)
AAOx3.  Skin warm and dry. MAE equally and strong.  C/O left leg pain. Gait favors right slightly.  NAD

## 2019-08-15 ENCOUNTER — Encounter: Payer: Self-pay | Admitting: Emergency Medicine

## 2019-08-15 ENCOUNTER — Other Ambulatory Visit: Payer: Self-pay

## 2019-08-15 ENCOUNTER — Emergency Department
Admission: EM | Admit: 2019-08-15 | Discharge: 2019-08-15 | Disposition: A | Discharge status: Home / Self Care | Payer: Medicaid Other | Attending: Emergency Medicine | Admitting: Emergency Medicine

## 2019-08-15 ENCOUNTER — Emergency Department: Payer: Medicaid Other

## 2019-08-15 DIAGNOSIS — G8929 Other chronic pain: Secondary | ICD-10-CM | POA: Diagnosis not present

## 2019-08-15 DIAGNOSIS — R05 Cough: Secondary | ICD-10-CM | POA: Insufficient documentation

## 2019-08-15 DIAGNOSIS — Z77120 Contact with and (suspected) exposure to mold (toxic): Secondary | ICD-10-CM | POA: Insufficient documentation

## 2019-08-15 DIAGNOSIS — M545 Low back pain, unspecified: Secondary | ICD-10-CM

## 2019-08-15 DIAGNOSIS — F17210 Nicotine dependence, cigarettes, uncomplicated: Secondary | ICD-10-CM | POA: Diagnosis not present

## 2019-08-15 DIAGNOSIS — F1210 Cannabis abuse, uncomplicated: Secondary | ICD-10-CM | POA: Diagnosis not present

## 2019-08-15 DIAGNOSIS — J029 Acute pharyngitis, unspecified: Secondary | ICD-10-CM | POA: Diagnosis present

## 2019-08-15 MED ORDER — CYCLOBENZAPRINE HCL 10 MG PO TABS
5.0000 mg | ORAL_TABLET | Freq: Once | ORAL | Status: AC
  Administered 2019-08-15: 5 mg via ORAL
  Filled 2019-08-15: qty 1

## 2019-08-15 MED ORDER — ALBUTEROL SULFATE HFA 108 (90 BASE) MCG/ACT IN AERS: 2.0000 | INHALATION_SPRAY | Freq: Four times a day (QID) | RESPIRATORY_TRACT | 0 refills | Status: AC | PRN

## 2019-08-15 MED ORDER — CETIRIZINE HCL 10 MG PO TABS: 10.0000 mg | ORAL_TABLET | Freq: Every day | ORAL | 0 refills | Status: DC

## 2019-08-15 NOTE — ED Notes (Signed)
See triage note  Presents with some hip pain and back pain   States she also has been having h/a and some diff breathing  States she has been exposed to black mold

## 2019-08-15 NOTE — ED Provider Notes (Signed)
[facility] Emergency Department Provider Note  ____________________________________________  Time seen: Approximately 2:17 PM  I have reviewed the triage vital signs and the nursing notes.   HISTORY  Chief Complaint Sore Throat, Cough, and Back Pain    HPI Monique Schaefer is a 53 y.o. female that presents to the temergency department with concerns of black mold exposure.  Patient states that she has visible black mold in her apartment for the last 6 months.  She has been unable to get out of her lease and wants documentation to show that the black mold is making her sick.  She regularly has headaches, runny nose, sore throat and a cough. Occasionally she has SOB. She states that she really noticed a black mold about 1 to 2 months ago when she discontinued using her air conditioning.  She can see the mold all over her walls.  She has not yet contacted the health department because she was waiting to get documentation first.  She just got approved for disability and medicade.  She has an appointment with Dr. Rudene Christians on Wednesday for chronic back pain.  She has an appointment with [facility] on December 14.  She smokes half pack cigarettes per day.  Patient still has some numbness to her left leg from a Toradol shot that she received a couple of months ago here.  Past Medical History:  Diagnosis Date  . ADD (attention deficit disorder)   . Anxiety   . Depression   . Fibromyalgia   . GERD (gastroesophageal reflux disease)   . Neuropathy   . Schizophrenia ([place])   . Scoliosis     Patient Active Problem List   Diagnosis Date Noted  . Cervical lymphadenopathy 06/08/2018  . Chronic active hepatitis ([place]) 06/08/2018  . Bilateral foot pain 05/04/2018  . Neuropathy 05/04/2018  . Elevated TSH 05/04/2018  . Exposure to hepatitis C 05/04/2018  . Back pain 03/04/2018  . Health care maintenance 03/04/2018  . 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 Cocaine abuse ([place]) 02/21/2018  . Cannabis abuse 02/21/2018  . Homelessness 02/21/2018    Past Surgical History:  Procedure Laterality Date  . BILATERAL CARPAL TUNNEL RELEASE Bilateral   . CHOLECYSTECTOMY    . FEMUR SURGERY Left   . JOINT REPLACEMENT    . REPLACEMENT TOTAL KNEE BILATERAL Bilateral     Prior to Admission medications   Medication Sig Start Date End Date Taking? Authorizing Provider  albuterol (VENTOLIN HFA) 108 (90 Base) MCG/ACT inhaler Inhale 2 puffs into the lungs every 6 (six) hours as needed for wheezing or shortness of breath. 08/15/19   Laban Emperor, PA-C  baclofen (LIORESAL) 10 MG tablet Take 1 tablet (10 mg total) by mouth 3 (three) times daily. 06/15/19 06/14/20  Caryn Section, Linden Dolin, PA-C  buprenorphine-naloxone (SUBOXONE) 8-2 mg SUBL SL tablet Place 1 tablet under the tongue daily.    [provider]  cetirizine (ZYRTEC ALLERGY) 10 MG tablet Take 1 tablet (10 mg total) by mouth daily. 08/15/19 08/14/20  Laban Emperor, PA-C  clotrimazole-betamethasone (LOTRISONE) cream Apply 1 application topically 2 (two) times daily. 03/08/19   Caryn Section Linden Dolin, PA-C  cyclobenzaprine (FLEXERIL) 5 MG tablet Take 1-2 tablets 3 times daily as needed 06/02/19   Laban Emperor, PA-C  gabapentin (NEURONTIN) 400 MG capsule Take 1 capsule (400 mg total) by mouth 3 (three) times daily. 01/04/19   Iloabachie, Chioma E, NP  hydrOXYzine (ATARAX/VISTARIL) 10 MG tablet Take 1  tablet (10 mg total) by mouth 3 (three) times daily as needed. 03/08/19   Fisher, Linden Dolin, PA-C  ketorolac (TORADOL) 10 MG tablet Take 1 tablet (10 mg total) by mouth every 6 (six) hours as needed. 06/02/19   Laban Emperor, PA-C  lidocaine (LIDODERM) 5 % Place 1 patch onto the skin every 12 (twelve) hours. Remove & Discard patch within 12 hours or as directed by MD 03/27/19 03/26/20  Laban Emperor, PA-C  omeprazole (PRILOSEC) 20 MG capsule TAKE ONE CAPSULE BY MOUTH EVERY DAY 11/18/18   Iloabachie,  Chioma E, NP  ondansetron (ZOFRAN) 4 MG tablet Take 1 tablet (4 mg total) by mouth every 8 (eight) hours as needed for nausea or vomiting. 05/03/19   Schuyler Amor, MD  oxybutynin (DITROPAN-XL) 10 MG 24 hr tablet Take 1 tablet (10 mg total) by mouth at bedtime. 12/14/18   Iloabachie, Chioma E, NP  QUEtiapine (SEROQUEL) 100 MG tablet Take 100 mg by mouth at bedtime.    [provider]  sertraline (ZOLOFT) 50 MG tablet Take 50 mg by mouth daily.    [provider]    Allergies Paxil [paroxetine hcl], Mango flavor, Morphine and related, Oxycodone-acetaminophen, Tramadol, and Trazodone and nefazodone  No family history on file.  Social History Social History   Tobacco Use  . Smoking status: Current Every Day Smoker    Packs/day: 0.50    Years: 15.00    Pack years: 7.50  . Smokeless tobacco: Never Used  Substance Use Topics  . Alcohol use: Not Currently    Frequency: Never  . Drug use: Not Currently    Types: Marijuana     Review of Systems  Constitutional: No fever/chills ENT: Positive for nasal congestion and sore throat. Cardiovascular: No chest pain. Respiratory: Positive for intermittent cough and SOB.  Gastrointestinal: No abdominal pain.  No nausea, no vomiting.  Musculoskeletal: Positive for back pain. Skin: Negative for rash, abrasions, lacerations, ecchymosis. Neurological: Positive for intermittent headaches.   ____________________________________________   PHYSICAL EXAM:  VITAL SIGNS: ED Triage Vitals  Enc Vitals Group     BP 08/15/19 1200 (!) 120/103     Pulse Rate 08/15/19 1200 100     Resp 08/15/19 1200 20     Temp 08/15/19 1200 99 F (37.2 C)     Temp Source 08/15/19 1200 Oral     SpO2 08/15/19 1200 100 %     Weight 08/15/19 1201 230 lb (104.3 kg)     Height 08/15/19 1201 5\' 7"  (1.702 m)     Head Circumference --      Peak Flow --      Pain Score 08/15/19 1201 7     Pain Loc --      Pain Edu? --      Excl. in [place]? --       Constitutional: Alert and oriented. Well appearing and in no acute distress. Eyes: Conjunctivae are normal. PERRL. EOMI. Head: Atraumatic. ENT:      Ears:      Nose: No congestion/rhinnorhea.      Mouth/Throat: Mucous membranes are moist.  Neck: No stridor.  Cardiovascular: Normal rate, regular rhythm.  Good peripheral circulation. Respiratory: Normal respiratory effort without tachypnea or retractions. Lungs CTAB. Good air entry to the bases with no decreased or absent breath sounds. Gastrointestinal: Bowel sounds 4 quadrants. Soft and nontender to palpation. No guarding or rigidity. No palpable masses. No distention.  Musculoskeletal: Full range of motion to all extremities. No gross deformities  appreciated. Neurologic:  Normal speech and language. No gross focal neurologic deficits are appreciated.  Skin:  Skin is warm, dry and intact. No rash noted. Psychiatric: Mood and affect are normal. Speech and behavior are normal. Patient exhibits appropriate insight and judgement.   ____________________________________________   LABS (all labs ordered are listed, but only abnormal results are displayed)  Labs Reviewed - No data to display ____________________________________________  EKG   ____________________________________________  RADIOLOGY Robinette Haines, personally viewed and evaluated these images (plain radiographs) as part of my medical decision making, as well as reviewing the written report by the radiologist.  Dg Chest Portable 1 View  Result Date: 08/15/2019 CLINICAL DATA:  Cough. EXAM: PORTABLE CHEST 1 VIEW COMPARISON:  November 18, 2018. FINDINGS: The heart size and mediastinal contours are within normal limits. Both lungs are clear. The visualized skeletal structures are unremarkable. IMPRESSION: No active disease. Electronically Signed   By: Marijo Conception M.D.   On: 08/15/2019 14:31     ____________________________________________    PROCEDURES  Procedure(s) performed:    Procedures    Medications  cyclobenzaprine (FLEXERIL) tablet 5 mg (5 mg Oral Given 08/15/19 1504)     ____________________________________________   INITIAL IMPRESSION / ASSESSMENT AND PLAN / ED COURSE  Pertinent labs & imaging results that were available during my care of the patient were reviewed by me and considered in my medical decision making (see chart for details).  Review of the [place] CSRS was performed in accordance of the [place] prior to dispensing any controlled drugs.    Patient presented to the emergency department for evaluation of black mold exposure.  Vital signs and exam are reassuring.  Chest x-ray negative for acute abnormalities.  Patient was given the number for environmental services with the health department.  She was also given referral to allergy and asthma clinic.  Patient was given a dose of Flexeril for her chronic back pain.  She will follow up with Dr. Rudene Christians on Wednesday.  Patient will be discharged home with prescriptions for Zyrtec and albuterol. Patient is to follow up with primary care and Ortho as directed. Patient is given ED precautions to return to the ED for any worsening or new symptoms.  Aithana Guin was evaluated in Emergency Department on 08/15/2019 for the symptoms described in the history of present illness. She was evaluated in the context of the global COVID-19 pandemic, which necessitated consideration that the patient might be at risk for infection with the SARS-CoV-2 virus that causes COVID-19. Institutional protocols and algorithms that pertain to the evaluation of patients at risk for COVID-19 are in a state of rapid change based on information released by regulatory bodies including the CDC and federal and state organizations. These policies and algorithms were followed during the patient's care in the  ED.   ____________________________________________  FINAL CLINICAL IMPRESSION(S) / ED DIAGNOSES  Final diagnoses:  Mold exposure  Chronic low back pain, unspecified back pain laterality, unspecified whether sciatica present      NEW MEDICATIONS STARTED DURING THIS VISIT:  ED Discharge Orders         Ordered    albuterol (VENTOLIN HFA) 108 (90 Base) MCG/ACT inhaler  Every 6 hours PRN     08/15/19 1500    cetirizine (ZYRTEC ALLERGY) 10 MG tablet  Daily     08/15/19 1500              This chart was dictated using voice recognition software/Dragon. Despite best efforts to proofread,  errors can occur which can change the meaning. Any change was purely unintentional.    Laban Emperor, PA-C 08/15/19 1524    Earleen Newport, MD 08/15/19 [phone]

## 2019-08-15 NOTE — ED Triage Notes (Signed)
Pt reports has a hx of back problems and has just been approved for her disability and needs help with pain control.

## 2019-08-15 NOTE — ED Triage Notes (Signed)
Pt reports has back pain and right hip pain. Pt reports she also wanted to be checked because she has been exposed to black mold and she has every symptom on google. Pt c/o cough, sore throat, nasal congestion and HA.

## 2019-08-22 ENCOUNTER — Other Ambulatory Visit: Payer: Self-pay | Admitting: Orthopedic Surgery

## 2019-08-22 DIAGNOSIS — M4807 Spinal stenosis, lumbosacral region: Secondary | ICD-10-CM

## 2019-08-22 DIAGNOSIS — G8929 Other chronic pain: Secondary | ICD-10-CM

## 2019-09-02 ENCOUNTER — Ambulatory Visit (INDEPENDENT_AMBULATORY_CARE_PROVIDER_SITE_OTHER): Payer: Medicaid Other | Admitting: Podiatry

## 2019-09-02 ENCOUNTER — Encounter: Payer: Self-pay | Admitting: Podiatry

## 2019-09-02 ENCOUNTER — Other Ambulatory Visit: Payer: Self-pay

## 2019-09-02 ENCOUNTER — Ambulatory Visit: Payer: Medicaid Other

## 2019-09-02 DIAGNOSIS — M2042 Other hammer toe(s) (acquired), left foot: Secondary | ICD-10-CM

## 2019-09-02 DIAGNOSIS — M2041 Other hammer toe(s) (acquired), right foot: Secondary | ICD-10-CM

## 2019-09-02 DIAGNOSIS — M7752 Other enthesopathy of left foot: Secondary | ICD-10-CM

## 2019-09-02 MED ORDER — MELOXICAM 15 MG PO TABS: 15.0000 mg | ORAL_TABLET | Freq: Every day | ORAL | 1 refills | Status: DC

## 2019-09-02 NOTE — Patient Instructions (Signed)
Pre-Operative Instructions  Congratulations, you have decided to take an important step towards improving your quality of life.  You can be assured that the doctors and staff at [facility] will be with you every step of the way.  Here are some important things you should know:  1. Plan to be at the surgery center/hospital at least 1 (one) hour prior to your scheduled time, unless otherwise directed by the surgical center/hospital staff.  You must have a responsible adult accompany you, remain during the surgery and drive you home.  Make sure you have directions to the surgical center/hospital to ensure you arrive on time. 2. If you are having surgery at [facility] or [facility] hospitals, you will need a copy of your medical history and physical form from your family physician within one month prior to the date of surgery. We will give you a form for your primary physician to complete.  3. We make every effort to accommodate the date you request for surgery.  However, there are times where surgery dates or times have to be moved.  We will contact you as soon as possible if a change in schedule is required.   4. No aspirin/ibuprofen for one week before surgery.  If you are on aspirin, any non-steroidal anti-inflammatory medications (Mobic, Aleve, Ibuprofen) should not be taken seven (7) days prior to your surgery.  You make take Tylenol for pain prior to surgery.  5. Medications - If you are taking daily heart and blood pressure medications, seizure, reflux, allergy, asthma, anxiety, pain or diabetes medications, make sure you notify the surgery center/hospital before the day of surgery so they can tell you which medications you should take or avoid the day of surgery. 6. No food or drink after midnight the night before surgery unless directed otherwise by surgical center/hospital staff. 7. No alcoholic beverages 24-hours prior to surgery.  No smoking 24-hours prior or 24-hours after  surgery. 8. Wear loose pants or shorts. They should be loose enough to fit over bandages, boots, and casts. 9. Don't wear slip-on shoes. Sneakers are preferred. 10. Bring your boot with you to the surgery center/hospital.  Also bring crutches or a walker if your physician has prescribed it for you.  If you do not have this equipment, it will be provided for you after surgery. 11. If you have not been contacted by the surgery center/hospital by the day before your surgery, call to confirm the date and time of your surgery. 12. Leave-time from work may vary depending on the type of surgery you have.  Appropriate arrangements should be made prior to surgery with your employer. 13. Prescriptions will be provided immediately following surgery by your doctor.  Fill these as soon as possible after surgery and take the medication as directed. Pain medications will not be refilled on weekends and must be approved by the doctor. 14. Remove nail polish on the operative foot and avoid getting pedicures prior to surgery. 15. Wash the night before surgery.  The night before surgery wash the foot and leg well with water and the antibacterial soap provided. Be sure to pay special attention to beneath the toenails and in between the toes.  Wash for at least three (3) minutes. Rinse thoroughly with water and dry well with a towel.  Perform this wash unless told not to do so by your physician.  Enclosed: 1 Ice pack (please put in freezer the night before surgery)   1 Hibiclens skin cleaner     Pre-op instructions  If you have any questions regarding the instructions, please do not hesitate to call our office.  [place]: [address], [place], [place] [address] -- [phone]  [place]: [address] [place], [place] [address] -- [phone]  [place]: [address]  [place], [place] [address] -- [phone]   Website: [email or web address] 

## 2019-09-05 ENCOUNTER — Other Ambulatory Visit: Payer: Self-pay | Admitting: Internal Medicine

## 2019-09-05 ENCOUNTER — Ambulatory Visit: Payer: Medicaid Other

## 2019-09-05 DIAGNOSIS — Z1231 Encounter for screening mammogram for malignant neoplasm of breast: Secondary | ICD-10-CM

## 2019-09-06 NOTE — Progress Notes (Signed)
   HPI: 53 y.o. female presenting today as a new patient with a chief complaint of constant aching and throbbing pain of the left foot that began about 8 years ago. She reports a previous injury of the foot. She also noted hammertoes bilaterally. Walking and balancing increases the pain. She has been taking Tylenol and Advil and soaking in hot water for treatment. Patient is here for further evaluation and treatment.   Past Medical History:  Diagnosis Date  . ADD (attention deficit disorder)   . Anxiety   . Depression   . Fibromyalgia   . GERD (gastroesophageal reflux disease)   . Neuropathy   . Schizophrenia ([place])   . Scoliosis       Objective: Physical Exam General: The patient is alert and oriented x3 in no acute distress.  Dermatology: Skin is cool, dry and supple bilateral lower extremities. Negative for open lesions or macerations.  Vascular: Palpable pedal pulses bilaterally. No edema or erythema noted. Capillary refill within normal limits.  Neurological: Epicritic and protective threshold grossly intact bilaterally.   Musculoskeletal Exam: All pedal and ankle joints range of motion within normal limits bilateral. Muscle strength 5/5 in all groups bilateral. Hammertoe contracture deformity noted to digits 2-5 of the left foot.  Radiographic Exam: Hammertoe contracture deformity noted to the interphalangeal joints and MPJ of the respective hammertoe digits mentioned on clinical musculoskeletal exam.  Dislocation of the 2nd MPJ of the left foot.     Assessment: 1. 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 2nd MPJ capsulitis left 3. 2nd MPJ dislocation left    Plan of Care:  1. Patient evaluated. X-Rays reviewed.  2. Injection of 0.5 mLs Celestone Soluspan injected into the 2nd MPJ of the left foot.  3. Prescription for Meloxicam provided to patient. 4. Today we discussed the conservative versus surgical management of the presenting pathology. The patient opts for  surgical management. All possible complications and details of the procedure were explained. All patient questions were answered. No guarantees were expressed or implied. 5. Authorization for surgery was initiated today. Surgery will consist of 2nd metatarsal head resection left; PIPJ arthroplasty with MPJ capsulotomy digits 2-5 left.  6. Return to clinic one week post op.     Edrick Kins, DPM [facility]  Dr. Edrick Kins, DPM    [address], [place] [address]                Office [phone]  Fax [phone]

## 2019-09-09 ENCOUNTER — Ambulatory Visit: Payer: Medicaid Other | Admitting: Podiatry

## 2019-09-13 ENCOUNTER — Ambulatory Visit: Payer: Self-pay | Admitting: Adult Health

## 2019-09-13 ENCOUNTER — Emergency Department: Payer: Medicaid Other

## 2019-09-13 ENCOUNTER — Telehealth: Payer: Self-pay | Admitting: *Deleted

## 2019-09-13 ENCOUNTER — Emergency Department
Admission: EM | Admit: 2019-09-13 | Discharge: 2019-09-13 | Disposition: A | Discharge status: Home / Self Care | Payer: Medicaid Other | Attending: Emergency Medicine | Admitting: Emergency Medicine

## 2019-09-13 ENCOUNTER — Other Ambulatory Visit: Payer: Self-pay

## 2019-09-13 DIAGNOSIS — M545 Low back pain, unspecified: Secondary | ICD-10-CM

## 2019-09-13 DIAGNOSIS — G8929 Other chronic pain: Secondary | ICD-10-CM | POA: Insufficient documentation

## 2019-09-13 DIAGNOSIS — F17210 Nicotine dependence, cigarettes, uncomplicated: Secondary | ICD-10-CM | POA: Diagnosis not present

## 2019-09-13 DIAGNOSIS — Z96653 Presence of artificial knee joint, bilateral: Secondary | ICD-10-CM | POA: Diagnosis not present

## 2019-09-13 DIAGNOSIS — Z590 Homelessness: Secondary | ICD-10-CM | POA: Insufficient documentation

## 2019-09-13 DIAGNOSIS — R32 Unspecified urinary incontinence: Secondary | ICD-10-CM

## 2019-09-13 LAB — URINALYSIS, COMPLETE (UACMP) WITH MICROSCOPIC
Bacteria, UA: NONE SEEN
Bilirubin Urine: NEGATIVE
Glucose, UA: NEGATIVE mg/dL
Ketones, ur: NEGATIVE mg/dL
Leukocytes,Ua: NEGATIVE
Nitrite: NEGATIVE
Protein, ur: NEGATIVE mg/dL
Specific Gravity, Urine: 1.014 (ref 1.005–1.030)
pH: 5 (ref 5.0–8.0)

## 2019-09-13 MED ORDER — ONDANSETRON HCL 4 MG/2ML IJ SOLN
4.0000 mg | Freq: Once | INTRAMUSCULAR | Status: AC
  Administered 2019-09-13: 4 mg via INTRAVENOUS
  Filled 2019-09-13: qty 2

## 2019-09-13 MED ORDER — LORAZEPAM 2 MG/ML IJ SOLN
1.0000 mg | Freq: Once | INTRAMUSCULAR | Status: AC
  Administered 2019-09-13: 1 mg via INTRAVENOUS
  Filled 2019-09-13: qty 1

## 2019-09-13 MED ORDER — HYDROMORPHONE HCL 1 MG/ML IJ SOLN
1.0000 mg | Freq: Once | INTRAMUSCULAR | Status: AC
  Administered 2019-09-13: 1 mg via INTRAVENOUS
  Filled 2019-09-13: qty 1

## 2019-09-13 MED ORDER — FENTANYL CITRATE (PF) 100 MCG/2ML IJ SOLN
50.0000 ug | Freq: Once | INTRAMUSCULAR | Status: AC
  Administered 2019-09-13: 50 ug via INTRAVENOUS
  Filled 2019-09-13: qty 2

## 2019-09-13 NOTE — ED Provider Notes (Signed)
[facility] Emergency Department Provider Note  ____________________________________________   First MD Initiated Contact with Patient 09/13/19 1623     (approximate)  I have reviewed the triage vital signs and the nursing notes.   HISTORY  Chief Complaint Back Pain    HPI Monique Schaefer is a 53 y.o. female presents emergency department complaining of acute on chronic low back pain.  States that she bent down to vacuum and felt a sharp pain running down her back and leg.  Painful to walk.  She states she is also urinating on herself without having the urge to urinate.  She states it will just run down her legs.  She denies any fever or chills.  Has been referred to the pain clinic by her regular doctor.    Past Medical History:  Diagnosis Date  . ADD (attention deficit disorder)   . Anxiety   . Depression   . Fibromyalgia   . GERD (gastroesophageal reflux disease)   . Neuropathy   . Schizophrenia ([place])   . Scoliosis     Patient Active Problem List   Diagnosis Date Noted  . Cervical lymphadenopathy 06/08/2018  . Chronic active hepatitis ([place]) 06/08/2018  . Bilateral foot pain 05/04/2018  . Neuropathy 05/04/2018  . Elevated TSH 05/04/2018  . Exposure to hepatitis C 05/04/2018  . Back pain 03/04/2018  . Health care maintenance 03/04/2018  . 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 Cocaine abuse ([place]) 02/21/2018  . Cannabis abuse 02/21/2018  . Homelessness 02/21/2018    Past Surgical History:  Procedure Laterality Date  . BILATERAL CARPAL TUNNEL RELEASE Bilateral   . CHOLECYSTECTOMY    . FEMUR SURGERY Left   . JOINT REPLACEMENT    . REPLACEMENT TOTAL KNEE BILATERAL Bilateral     Prior to Admission medications   Medication Sig Start Date End Date Taking? Authorizing Provider  albuterol (VENTOLIN HFA) 108 (90 Base) MCG/ACT inhaler Inhale 2 puffs into the lungs every 6 (six) hours as needed for  wheezing or shortness of breath. 08/15/19   Laban Emperor, PA-C  baclofen (LIORESAL) 10 MG tablet Take 1 tablet (10 mg total) by mouth 3 (three) times daily. 06/15/19 06/14/20  Kysha Muralles, Linden Dolin, PA-C  clotrimazole-betamethasone (LOTRISONE) cream Apply 1 application topically 2 (two) times daily. 03/08/19   Rashaan Wyles, Linden Dolin, PA-C  doxepin (SINEQUAN) 50 MG capsule Take 50-100 mg by mouth at bedtime. 08/01/19   [provider]  gabapentin (NEURONTIN) 400 MG capsule Take 1 capsule (400 mg total) by mouth 3 (three) times daily. 01/04/19   Iloabachie, Chioma E, NP  hydrOXYzine (ATARAX/VISTARIL) 10 MG tablet Take 1 tablet (10 mg total) by mouth 3 (three) times daily as needed. 03/08/19   Bertel Venard, Linden Dolin, PA-C  meloxicam (MOBIC) 15 MG tablet Take 1 tablet (15 mg total) by mouth daily. 09/02/19   Edrick Kins, DPM  oxybutynin (DITROPAN-XL) 10 MG 24 hr tablet Take 1 tablet (10 mg total) by mouth at bedtime. 12/14/18   Iloabachie, Chioma E, NP  sertraline (ZOLOFT) 50 MG tablet Take 50 mg by mouth daily.    [provider]    Allergies Paxil [paroxetine hcl], Mango flavor, Morphine and related, Oxycodone-acetaminophen, Tramadol, and Trazodone and nefazodone  History reviewed. No pertinent family history.  Social History Social History   Tobacco Use  . Smoking status: Current Every Day Smoker    Packs/day: 0.50    Years: 15.00    Pack years: 7.50  .  Smokeless tobacco: Never Used  Substance Use Topics  . Alcohol use: Not Currently  . Drug use: Not Currently    Types: Marijuana    Review of Systems  Constitutional: No fever/chills Eyes: No visual changes. ENT: No sore throat. Respiratory: Denies cough Genitourinary: Negative for dysuria. Musculoskeletal: Positive for back pain. Skin: Negative for rash.    ____________________________________________   PHYSICAL EXAM:  VITAL SIGNS: ED Triage Vitals  Enc Vitals Group     BP 09/13/19 1534 110/72     Pulse Rate 09/13/19 1534  (!) 110     Resp 09/13/19 1534 18     Temp 09/13/19 1534 98.5 F (36.9 C)     Temp src --      SpO2 09/13/19 1534 96 %     Weight 09/13/19 1535 230 lb (104.3 kg)     Height 09/13/19 1535 5\' 7"  (1.702 m)     Head Circumference --      Peak Flow --      Pain Score 09/13/19 1535 10     Pain Loc --      Pain Edu? --      Excl. in [place]? --     Constitutional: Alert and oriented. Well appearing and in no acute distress. Eyes: Conjunctivae are normal.  Head: Atraumatic. Nose: No congestion/rhinnorhea. Mouth/Throat: Mucous membranes are moist.   Neck:  supple no lymphadenopathy noted Cardiovascular: Normal rate, regular rhythm. Respiratory: Normal respiratory effort.  No retractions,  Abd: soft nontender bs normal all 4 quad GU: deferred Musculoskeletal: Patient is lying on the left side, lumbar spine is tender, neurovascular is intact, decreased range of motion secondary to pain  neurologic:  Normal speech and language.  Skin:  Skin is warm, dry and intact. No rash noted. Psychiatric: Mood and affect are normal. Speech and behavior are normal.  ____________________________________________   LABS (all labs ordered are listed, but only abnormal results are displayed)  Labs Reviewed  URINALYSIS, COMPLETE (UACMP) WITH MICROSCOPIC - Abnormal; Notable for the following components:      Result Value   Color, Urine YELLOW (*)    APPearance CLEAR (*)    Hgb urine dipstick SMALL (*)    All other components within normal limits   ____________________________________________   ____________________________________________  RADIOLOGY  MRI of lumbar spine to rule out cauda equina  Mri is negative  ____________________________________________   PROCEDURES  Procedure(s) performed: Saline lock, fentanyl 50 mcg   Procedures    ____________________________________________   INITIAL IMPRESSION / ASSESSMENT AND PLAN / ED COURSE  Pertinent labs & imaging results that were  available during my care of the patient were reviewed by me and considered in my medical decision making (see chart for details).   Patient is 53 year old female presents emergency department with chronic but acute low back pain.  Concerns of cauda equina as she states she is urinating on herself without having the urge to urinate.  Physical exam patient does appear uncomfortable.  Lumbar spine is tender.  MRI of the lumbar spine ordered to rule out cauda equina  Saline lock, fentanyl 50 mcg IV, ativan 1mg  iv  MRI of the lumbar spine is negative for cauda equina.  UA is normal.  Explained the findings to the patient. encouraged her to follow-up with urology due to the incontinence.  She is to follow-up with pain management tomorrow.  She states she understands and will comply.  She was discharged in stable condition.     Monique Schaefer was  evaluated in Emergency Department on 09/13/2019 for the symptoms described in the history of present illness. She was evaluated in the context of the global COVID-19 pandemic, which necessitated consideration that the patient might be at risk for infection with the SARS-CoV-2 virus that causes COVID-19. Institutional protocols and algorithms that pertain to the evaluation of patients at risk for COVID-19 are in a state of rapid change based on information released by regulatory bodies including the CDC and federal and state organizations. These policies and algorithms were followed during the patient's care in the ED.   As part of my medical decision making, I reviewed the following data within the [facility] notes reviewed and incorporated, Labs reviewed , Old chart reviewed, Radiograph reviewed , Notes from prior ED visits and  Controlled Substance Database  ____________________________________________   FINAL CLINICAL IMPRESSION(S) / ED DIAGNOSES  Final diagnoses:  Acute exacerbation of chronic low back pain  Incontinence in  female      NEW MEDICATIONS STARTED DURING THIS VISIT:  Discharge Medication List as of 09/13/2019  7:48 PM       Note:  This document was prepared using Dragon voice recognition software and may include unintentional dictation errors.    Versie Starks, PA-C 09/13/19 2011    Carrie Mew, MD 09/13/19 [phone]

## 2019-09-13 NOTE — ED Notes (Signed)
See triage note  Presents with pain to back  States she felt pain while cleaning  Denies any falls  But states pain is moving into leg  Ambulates well

## 2019-09-13 NOTE — ED Notes (Signed)
Pt very lethargic at discharge with this RN. Pt also requesting more pain medicine. This RN advised pt that we would not send her home with any pain medication. Pt then wheel ed out to lobby to use phone to call for transport home. Pt access made aware.

## 2019-09-13 NOTE — Telephone Encounter (Signed)
"  I am calling to see if I'm scheduled for my surgery on December 31.  It's on a Thursday.  I can't do that stuff online.  I don't know what time or anything.  I'd like to do it sooner if possible."  You are already scheduled for surgery on December 31.  If you are referring to registering with the surgery center through there online portal, don't worry about it.  Someone from the surgical center will give you a call to get the information that is needed.  You will also get a call from someone from the surgery center a day or two prior to your surgery date and that person will give you your arrival time.  "Okay, I am glad because I don't know how to do all that.  I am looking forward to having this done.  Please let me know if you have any cancellations.  I'd like to do this sooner."  I will let you know.

## 2019-09-13 NOTE — ED Triage Notes (Signed)
Pt comes via POV from home with c/o back pain. Pt states she was cleaning and vacuum and just felt sharp pain run down her back and leg. Pt states pain to walk.  Pt denies any fall. Pt states hx of back pain.

## 2019-09-19 ENCOUNTER — Ambulatory Visit: Payer: Medicaid Other

## 2019-09-20 ENCOUNTER — Telehealth: Payer: Self-pay | Admitting: *Deleted

## 2019-09-20 NOTE — Telephone Encounter (Signed)
"  I am calling to reschedule my surgery that's scheduled for December 31.  The tall good looking doctor is my doctor.  [name] isn't going to be able to be here then.  She wants me to do it when she's here.  So, I need to reschedule it.  I definitely want to reschedule it.  My doctor is fine.  When I first saw him, I thought I had gone to heaven.  I saw him get into his white truck and my heart throbbed even more because [place] and that's all you see is men driving trucks."  Dr. Amalia Hailey' next available date is November 24, 2019.  "That date will be fine.  You'll call me when it gets closer to the date, correct?"  Someone from the surgical center will give you a call a day or two prior to your surgery date.  "Okay, please tell my doctor to have a good day!"  I called Caren Griffins at [facility] and rescheduled her surgery from 09/29/2019 to 11/24/2019.

## 2019-09-21 ENCOUNTER — Telehealth: Payer: Self-pay | Admitting: Pharmacy Technician

## 2019-09-21 NOTE — Telephone Encounter (Signed)
Patient has Medicaid with prescription drug coverage.  No longer meets the eligibility criteria for [facility].  Patient notified.  [facility]

## 2019-09-21 NOTE — Telephone Encounter (Signed)
Patient failed to provide requested 2020 financial documentation.  No additional medication assistance will be provided by [facility] without the required proof of income documentation.  Patient notified by letter.  Brynnlee Cumpian J. Katyra Tomassetti Care Manager [facility] 

## 2019-09-26 ENCOUNTER — Ambulatory Visit: Payer: Self-pay | Admitting: Urology

## 2019-10-03 ENCOUNTER — Encounter: Payer: Self-pay | Admitting: Urology

## 2019-10-03 ENCOUNTER — Other Ambulatory Visit: Payer: Self-pay

## 2019-10-03 ENCOUNTER — Ambulatory Visit (INDEPENDENT_AMBULATORY_CARE_PROVIDER_SITE_OTHER): Payer: Medicaid Other | Admitting: Urology

## 2019-10-03 VITALS — BP 142/76 | HR 88 | Ht 67.0 in | Wt 235.0 lb

## 2019-10-03 DIAGNOSIS — N3946 Mixed incontinence: Secondary | ICD-10-CM

## 2019-10-03 DIAGNOSIS — N200 Calculus of kidney: Secondary | ICD-10-CM

## 2019-10-03 DIAGNOSIS — R32 Unspecified urinary incontinence: Secondary | ICD-10-CM

## 2019-10-03 DIAGNOSIS — R319 Hematuria, unspecified: Secondary | ICD-10-CM | POA: Diagnosis not present

## 2019-10-03 LAB — URINALYSIS, COMPLETE
Bilirubin, UA: NEGATIVE
Glucose, UA: NEGATIVE
Ketones, UA: NEGATIVE
Leukocytes,UA: NEGATIVE
Nitrite, UA: NEGATIVE
Protein,UA: NEGATIVE
RBC, UA: NEGATIVE
Specific Gravity, UA: 1.025 (ref 1.005–1.030)
Urobilinogen, Ur: 0.2 mg/dL (ref 0.2–1.0)
pH, UA: 5.5 (ref 5.0–7.5)

## 2019-10-03 LAB — MICROSCOPIC EXAMINATION
Bacteria, UA: NONE SEEN
RBC, Urine: NONE SEEN /hpf (ref 0–2)

## 2019-10-03 NOTE — Progress Notes (Signed)
10/03/2019 1:25 PM   Monique Schaefer 02-Jul-1966 [id number]  Referring provider: Lavera Guise, [place],  [place] [address]  Chief Complaint  Patient presents with  . Urinary Incontinence    HPI: I was consulted to assess the patient's mixed incontinence and vague abdominal pain.  She leaks with coughing sneezing sometimes bending lifting.  She leaks if she reaches.  She has urge incontinence she holds it too long.  She has mild to moderate bedwetting.  She was wearing 6 pads a day that were wet.  She gets up 4-5 times a night with no ankle edema.  She voids every 45 to 60 minutes due to fullness.  She is on 1 bladder medication now helping some  Normal flow  Has passed 2 kidney stones.  No previous bladder or kidney stone surgery.  Has intermittent vague lower abdominal pain and low back pain  No hysterectomy.  No neurologic issues.  Modifying factors: There are no other modifying factors  Associated signs and symptoms: There are no other associated signs and symptoms Aggravating and relieving factors: There are no other aggravating or relieving factors Severity: Moderate Duration: Persistent   PMH: Past Medical History:  Diagnosis Date  . ADD (attention deficit disorder)   . Anxiety   . Depression   . Fibromyalgia   . GERD (gastroesophageal reflux disease)   . Neuropathy   . Schizophrenia ([place])   . Scoliosis     Surgical History: Past Surgical History:  Procedure Laterality Date  . BILATERAL CARPAL TUNNEL RELEASE Bilateral   . CHOLECYSTECTOMY    . FEMUR SURGERY Left   . JOINT REPLACEMENT    . REPLACEMENT TOTAL KNEE BILATERAL Bilateral     Home Medications:  Allergies as of 10/03/2019      Reactions   Paxil [paroxetine Hcl] Swelling   Mango Flavor Hives   Morphine And Related Itching   Oxycodone-acetaminophen Itching   Tramadol Itching   Trazodone And Nefazodone Itching      Medication List       Accurate as of October 03, 2019  1:25  PM. If you have any questions, ask your nurse or doctor.        STOP taking these medications   oxybutynin 10 MG 24 hr tablet Commonly known as: DITROPAN-XL Stopped by: Reece Packer, MD   sertraline 50 MG tablet Commonly known as: ZOLOFT Stopped by: Reece Packer, MD     TAKE these medications   albuterol 108 (90 Base) MCG/ACT inhaler Commonly known as: VENTOLIN HFA Inhale 2 puffs into the lungs every 6 (six) hours as needed for wheezing or shortness of breath.   baclofen 10 MG tablet Commonly known as: LIORESAL Take 1 tablet (10 mg total) by mouth 3 (three) times daily.   clotrimazole-betamethasone cream Commonly known as: LOTRISONE Apply 1 application topically 2 (two) times daily.   doxepin 50 MG capsule Commonly known as: SINEQUAN Take 50-100 mg by mouth at bedtime.   gabapentin 400 MG capsule Commonly known as: NEURONTIN Take 1 capsule (400 mg total) by mouth 3 (three) times daily.   hydrOXYzine 10 MG tablet Commonly known as: ATARAX/VISTARIL Take 1 tablet (10 mg total) by mouth 3 (three) times daily as needed.   meloxicam 15 MG tablet Commonly known as: MOBIC Take 1 tablet (15 mg total) by mouth daily.   Myrbetriq 50 MG Tb24 tablet Generic drug: mirabegron ER Myrbetriq 50 MG Oral Tablet Extended Release 24 Hour QTY: 30 tablet  Days: 30 Refills: 6  Written: 09/02/19 Patient Instructions: take one tablet by mouth once daily       Allergies:  Allergies  Allergen Reactions  . Paxil [Paroxetine Hcl] Swelling  . Mango Flavor Hives  . Morphine And Related Itching  . Oxycodone-Acetaminophen Itching  . Tramadol Itching  . Trazodone And Nefazodone Itching    Family History: No family history on file.  Social History:  reports that she has been smoking. She has a 7.50 pack-year smoking history. She has never used smokeless tobacco. She reports previous alcohol use. She reports previous drug use. Drug: Marijuana.  ROS: UROLOGY Frequent  Urination?: Yes Hard to postpone urination?: No Burning/pain with urination?: No Get up at night to urinate?: Yes Leakage of urine?: Yes Urine stream starts and stops?: No Trouble starting stream?: Yes Do you have to strain to urinate?: Yes Blood in urine?: No Urinary tract infection?: No Sexually transmitted disease?: No Injury to kidneys or bladder?: No Painful intercourse?: No Weak stream?: No Currently pregnant?: No Vaginal bleeding?: No Last menstrual period?: n  Gastrointestinal Nausea?: No Vomiting?: No Indigestion/heartburn?: No Diarrhea?: No Constipation?: No  Constitutional Fever: No Night sweats?: No Weight loss?: No Fatigue?: No  Skin Skin rash/lesions?: No Itching?: No  Eyes Blurred vision?: No Double vision?: No  Ears/Nose/Throat Sore throat?: No Sinus problems?: No  Hematologic/Lymphatic Swollen glands?: No Easy bruising?: No  Cardiovascular Leg swelling?: No Chest pain?: No  Respiratory Cough?: No Shortness of breath?: No  Endocrine Excessive thirst?: No  Musculoskeletal Back pain?: No Joint pain?: No  Neurological Headaches?: No Dizziness?: No  Psychologic Depression?: No Anxiety?: No  Physical Exam: BP (!) 142/76   Pulse 88   Ht 5\' 7"  (1.702 m)   Wt 235 lb (106.6 kg)   LMP  (LMP Unknown)   BMI 36.81 kg/m   Constitutional:  Alert and oriented, No acute distress. HEENT: [place] AT, moist mucus membranes.  Trachea midline, no masses. Cardiovascular: No clubbing, cyanosis, or edema. Respiratory: Normal respiratory effort, no increased work of breathing. GI: Abdomen is soft, nontender, nondistended, no abdominal masses GU: Mild positive cough test with mild grade 2 hypermobility bladder neck.  No prolapse.  Mild vaginal narrowing Skin: No rashes, bruises or suspicious lesions. Lymph: No cervical or inguinal adenopathy. Neurologic: Grossly intact, no focal deficits, moving all 4 extremities. Psychiatric: Normal mood and  affect.  Laboratory Data: Lab Results  Component Value Date   WBC 7.4 05/03/2019   HGB 14.3 05/03/2019   HCT 42.7 05/03/2019   MCV 91.0 05/03/2019   PLT 218 05/03/2019    Lab Results  Component Value Date   CREATININE 0.55 05/03/2019    No results found for: PSA  No results found for: TESTOSTERONE  Lab Results  Component Value Date   HGBA1C 5.3 03/24/2018    Urinalysis    Component Value Date/Time   COLORURINE YELLOW (A) 09/13/2019 1918   APPEARANCEUR CLEAR (A) 09/13/2019 1918   APPEARANCEUR Clear 11/18/2018 1413   LABSPEC 1.014 09/13/2019 1918   [place] 5.0 09/13/2019 1918   GLUCOSEU NEGATIVE 09/13/2019 1918   HGBUR SMALL (A) 09/13/2019 1918   BILIRUBINUR NEGATIVE 09/13/2019 1918   BILIRUBINUR Negative 11/18/2018 1413   [place] 09/13/2019 1918   PROTEINUR NEGATIVE 09/13/2019 1918   NITRITE NEGATIVE 09/13/2019 1918   LEUKOCYTESUR NEGATIVE 09/13/2019 1918    Pertinent Imaging:   Assessment & Plan: Patient has mixed incontinence.  Role of urodynamics discussed.  She has a very frequent bladder.  I  do not think she has interstitial cystitis.  The role of CT scan for chronic low back pain and vague suprapubic discomfort discussed.   Urodynamics ordered.  CT scan ordered.  Call if CT scan abnormal  I reviewed the medical records and her urinalysis.  Urine culture and urodynamics ordered 1. Urinary incontinence, unspecified type  - Urinalysis, Complete   No follow-ups on file.  Reece Packer, MD  [facility] [address], [place], [place] [address] [phone]

## 2019-10-04 ENCOUNTER — Other Ambulatory Visit: Payer: Self-pay

## 2019-10-05 LAB — CULTURE, URINE COMPREHENSIVE

## 2019-10-07 ENCOUNTER — Encounter: Payer: Medicaid Other | Admitting: Podiatry

## 2019-10-07 ENCOUNTER — Other Ambulatory Visit: Payer: Self-pay

## 2019-10-07 ENCOUNTER — Encounter: Payer: Self-pay | Admitting: Podiatry

## 2019-10-07 ENCOUNTER — Ambulatory Visit (INDEPENDENT_AMBULATORY_CARE_PROVIDER_SITE_OTHER): Payer: Medicaid Other | Admitting: Podiatry

## 2019-10-07 DIAGNOSIS — M7752 Other enthesopathy of left foot: Secondary | ICD-10-CM | POA: Diagnosis not present

## 2019-10-07 DIAGNOSIS — M7672 Peroneal tendinitis, left leg: Secondary | ICD-10-CM

## 2019-10-07 DIAGNOSIS — M2041 Other hammer toe(s) (acquired), right foot: Secondary | ICD-10-CM | POA: Diagnosis not present

## 2019-10-07 DIAGNOSIS — M2042 Other hammer toe(s) (acquired), left foot: Secondary | ICD-10-CM

## 2019-10-07 MED ORDER — METHYLPREDNISOLONE 4 MG PO TBPK: ORAL_TABLET | ORAL | 0 refills | Status: DC

## 2019-10-10 ENCOUNTER — Telehealth: Payer: Self-pay | Admitting: Podiatry

## 2019-10-10 NOTE — Telephone Encounter (Signed)
I'm calling to get the date of my surgery in February. Thank you.

## 2019-10-10 NOTE — Telephone Encounter (Signed)
Left voicemail letting pt know her sx is scheduled for Thursday, 25 February. Told her to call Delydia back directly with any other questions she may have.

## 2019-10-11 NOTE — Progress Notes (Signed)
   HPI: 54 y.o. female presenting today for follow up evaluation of pain of the toes and left foot. She reports a painful nodule to the lateral aspect of the left foot. She reports associated redness and swelling that began two days ago. She has not done anything for treatment of the symptoms. Walking and bearing weight increases the pain. Patient is here for further evaluation and treatment.   Past Medical History:  Diagnosis Date  . ADD (attention deficit disorder)   . Anxiety   . Depression   . Fibromyalgia   . GERD (gastroesophageal reflux disease)   . Neuropathy   . Schizophrenia ([place])   . Scoliosis       Objective: Physical Exam General: The patient is alert and oriented x3 in no acute distress.  Dermatology: Skin is cool, dry and supple bilateral lower extremities. Negative for open lesions or macerations.  Vascular: Palpable pedal pulses bilaterally. No edema or erythema noted. Capillary refill within normal limits.  Neurological: Epicritic and protective threshold grossly intact bilaterally.   Musculoskeletal Exam: All pedal and ankle joints range of motion within normal limits bilateral. Muscle strength 5/5 in all groups bilateral. Hammertoe contracture deformity noted to digits 2-5 of the left foot. Pain with palpation to the insertion of the peroneal tendon of the left foot.  Assessment: 1. 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 2nd MPJ capsulitis left 3. 2nd MPJ dislocation left  4. Insertional peroneal tendinitis / bursitis left    Plan of Care:  1. Patient evaluated.  2. Injection of 0.5 mLs [place] Soluspan injected into the peroneal tendon sheath left foot at the insertion of the fifth metatarsal tubercle 3. Prescription for Meloxicam provided to patient. 4. Authorization for surgery was initiated today. Surgery will consist of 2nd metatarsal head resection left; PIPJ arthroplasty with MPJ capsulotomy digits 2-5 left. Surgery scheduled for February 2021.    5. CAM boot dispensed.  6. Return to clinic in 3 weeks. If not better we will add excision of bursa to surgical consent.      Edrick Kins, DPM [facility]  Dr. Edrick Kins, DPM    [address], [place] [address]                Office [phone]  Fax [phone]

## 2019-10-14 ENCOUNTER — Telehealth: Payer: Self-pay | Admitting: Podiatry

## 2019-10-14 ENCOUNTER — Telehealth: Payer: Self-pay | Admitting: Urology

## 2019-10-14 NOTE — Telephone Encounter (Signed)
I'm scheduled for surgery on 02/25 and my daughter will be going out of town. Do you have anything earlier? I told the pt Dr. Amalia Hailey is scheduled into March. Pt stated she would keep her sx on 02/25.

## 2019-10-14 NOTE — Telephone Encounter (Signed)
Pt is getting up at least 7 times per night and having to change her pad during the day and at night because it's soaked.  Medicaid is not providing transportation at this time due to [place].

## 2019-10-14 NOTE — Telephone Encounter (Signed)
Spoke with patient she denies dysuria, hematuria and fever. Leakage and frequency are worse while taking myrbetriq. She will make appointments for CTscan and UDS per Dr. Mikle Bosworth note once her transportation is available. Patient was encouraged to keep caffeine low in her diet and keep with her Kegel exercises

## 2019-10-18 ENCOUNTER — Encounter: Payer: Medicaid Other | Admitting: Podiatry

## 2019-10-19 ENCOUNTER — Telehealth: Payer: Self-pay | Admitting: Podiatry

## 2019-10-19 NOTE — Telephone Encounter (Signed)
I returned patient call, she stated that she is still having a lot of pain in her foot that is going to have surgery on.  She said she had been soaking in warm water and elevating but nothing was working.  I instructed her to continue soaking in Epson salt, ice as needed, take Ibuprofen 600mg  along with 2 ES Tylenol this evening to help her sleep and she will call in the morning to get a follow up appt with Dr. Amalia Hailey.  She verbalized instructions

## 2019-10-19 NOTE — Telephone Encounter (Signed)
Pt called requesting a call back from the nurse asap. Pt states she's in severe pain and would like a call to discuss some options to help.  Please give patient a call.

## 2019-10-24 ENCOUNTER — Ambulatory Visit: Payer: Medicaid Other | Admitting: Gastroenterology

## 2019-10-24 ENCOUNTER — Telehealth: Payer: Self-pay | Admitting: *Deleted

## 2019-10-24 NOTE — Telephone Encounter (Signed)
Left message informing pt she should continue the prescribed treatments, rest, ice 3-4 times daily protecting the skin from the ice with a light cloth and wear a stiff bottom shoe and discuss concerns with Dr. Amalia Hailey tomorrow at 9:45am.

## 2019-10-24 NOTE — Telephone Encounter (Signed)
Pt called states she is having a lot of pain, moving and doing all of the treatments recommended but still having pain.

## 2019-10-25 ENCOUNTER — Ambulatory Visit: Payer: Medicaid Other | Admitting: Gastroenterology

## 2019-10-25 ENCOUNTER — Ambulatory Visit: Payer: Medicaid Other | Admitting: Podiatry

## 2019-10-25 ENCOUNTER — Telehealth: Payer: Self-pay | Admitting: Podiatry

## 2019-10-25 NOTE — Telephone Encounter (Signed)
Per Dr. Amalia Hailey, he will not give any pain medication, she can take her Meloxicam for pain and make a follow up appt if pain continues to be bad.  I called patient and left voice message of Dr. Amalia Hailey instructions

## 2019-10-25 NOTE — Progress Notes (Unsigned)
Jonathon Bellows , MD [address]  [place]  [place], [place] [address]  Main: [phone]  Fax: [phone]   Primary Care Physician: Lavera Guise, MD  Virtual Visit via Video Note  I connected with patient on 10/25/19 at  2:15 PM EST by video and verified that I am speaking with the correct person using two identifiers.   I discussed the limitations, risks, security and privacy concerns of performing an evaluation and management service by video  and the availability of in person appointments. I also discussed with the patient that there may be a patient responsible charge related to this service. The patient expressed understanding and agreed to proceed.  Location of Patient: Home Location of Provider: Home Persons involved: Patient and provider only   History of Present Illness: No chief complaint on file.   HPI: Monique Schaefer is a 54 y.o. female        Current Outpatient Medications  Medication Sig Dispense Refill  . albuterol (VENTOLIN HFA) 108 (90 Base) MCG/ACT inhaler Inhale 2 puffs into the lungs every 6 (six) hours as needed for wheezing or shortness of breath. 6.7 g 0  . baclofen (LIORESAL) 10 MG tablet Take 1 tablet (10 mg total) by mouth 3 (three) times daily. 21 tablet 0  . clotrimazole-betamethasone (LOTRISONE) cream Apply 1 application topically 2 (two) times daily. 30 g 0  . doxepin (SINEQUAN) 50 MG capsule Take 50-100 mg by mouth at bedtime.    . gabapentin (NEURONTIN) 400 MG capsule Take 1 capsule (400 mg total) by mouth 3 (three) times daily. 90 capsule 2  . hydrOXYzine (ATARAX/VISTARIL) 10 MG tablet Take 1 tablet (10 mg total) by mouth 3 (three) times daily as needed. 30 tablet 0  . meloxicam (MOBIC) 15 MG tablet Take 1 tablet (15 mg total) by mouth daily. 30 tablet 1  . methylPREDNISolone (MEDROL DOSEPAK) 4 MG TBPK tablet 6 day dose pack - take as directed 21 tablet 0  . mirabegron ER (MYRBETRIQ) 50 MG TB24 tablet Myrbetriq 50 MG Oral Tablet  Extended Release 24 Hour QTY: 30 tablet Days: 30 Refills: 6  Written: 09/02/19 Patient Instructions: take one tablet by mouth once daily     No current facility-administered medications for this visit.    Allergies as of 10/25/2019 - Review Complete 10/07/2019  Allergen Reaction Noted  . Paxil [paroxetine hcl] Swelling 02/21/2018  . Mango flavor Hives 05/04/2018  . Morphine and related Itching 02/21/2018  . Oxycodone-acetaminophen Itching 10/09/2017  . Tramadol Itching 03/27/2019  . Trazodone and nefazodone Itching 09/07/2018    Review of Systems:    All systems reviewed and negative except where noted in HPI.  General Appearance:    Alert, cooperative, no distress, appears stated age  Head:    Normocephalic, without obvious abnormality, atraumatic  Eyes:    PERRL, conjunctiva/corneas clear,  Ears:    Grossly normal hearing    Neurologic:  Grossly normal    Observations/Objective:  Labs: CMP     Component Value Date/Time   NA 138 05/03/2019 1503   NA 142 06/08/2018 1852   K 4.0 05/03/2019 1503   CL 108 05/03/2019 1503   CO2 23 05/03/2019 1503   GLUCOSE 117 (H) 05/03/2019 1503   BUN 11 05/03/2019 1503   BUN 23 06/08/2018 1852   CREATININE 0.55 05/03/2019 1503   CALCIUM 8.8 (L) 05/03/2019 1503   PROT 7.0 05/03/2019 1503   PROT 6.2 06/08/2018 1852   PROT 6.3 06/08/2018 1852  ALBUMIN 3.8 05/03/2019 1503   ALBUMIN 3.9 06/08/2018 1852   AST 31 05/03/2019 1503   ALT 23 05/03/2019 1503   ALKPHOS 104 05/03/2019 1503   BILITOT 0.7 05/03/2019 1503   BILITOT 0.2 06/08/2018 1852   GFRNONAA >60 05/03/2019 1503   GFRAA >60 05/03/2019 1503   Lab Results  Component Value Date   WBC 7.4 05/03/2019   HGB 14.3 05/03/2019   HCT 42.7 05/03/2019   MCV 91.0 05/03/2019   PLT 218 05/03/2019    Imaging Studies: No results found.  Assessment and Plan:   Monique Schaefer is a 54 y.o. y/o female ***        I discussed the assessment and treatment plan with the patient. The  patient was provided an opportunity to ask questions and all were answered. The patient agreed with the plan and demonstrated an understanding of the instructions.   The patient was advised to call back or seek an in-person evaluation if the symptoms worsen or if the condition fails to improve as anticipated.  I provided *** minutes of face-to-face time during this encounter.  Dr Jonathon Bellows MD,MRCP [facility]) Gastroenterology/Hepatology Pager: [phone]   Speech recognition software was used to dictate this note.

## 2019-10-25 NOTE — Telephone Encounter (Signed)
Patient called to say that she wanted Dr.Evans  To know she was in very bad pain and would like something for  Pain. The pt has canceled last 2 appts

## 2019-10-26 ENCOUNTER — Telehealth: Payer: Self-pay | Admitting: Podiatry

## 2019-10-26 NOTE — Telephone Encounter (Signed)
I'm scheduled for sx with Dr. Amalia Hailey on 02/25 and I'm having a lot of problems with my foot. I was wondering if anyone has cancelled or anything like that where I could get in sooner for sx. Please call me back at [phone]. Thank you.

## 2019-10-27 NOTE — Telephone Encounter (Signed)
I left her a message that Dr. Amalia Hailey has not had any cancellations prior to her scheduled date of 11/24/2019.  I told her I would keep her in mind if there's any cancellations prior to that date.

## 2019-10-28 ENCOUNTER — Other Ambulatory Visit: Payer: Self-pay

## 2019-10-28 ENCOUNTER — Encounter: Payer: Self-pay | Admitting: Emergency Medicine

## 2019-10-28 ENCOUNTER — Ambulatory Visit: Payer: Medicaid Other | Admitting: Podiatry

## 2019-10-28 ENCOUNTER — Emergency Department
Admission: EM | Admit: 2019-10-28 | Discharge: 2019-10-28 | Disposition: A | Discharge status: Home / Self Care | Payer: Medicaid Other | Attending: Emergency Medicine | Admitting: Emergency Medicine

## 2019-10-28 DIAGNOSIS — R300 Dysuria: Secondary | ICD-10-CM | POA: Diagnosis not present

## 2019-10-28 DIAGNOSIS — R109 Unspecified abdominal pain: Secondary | ICD-10-CM | POA: Diagnosis not present

## 2019-10-28 DIAGNOSIS — Z5321 Procedure and treatment not carried out due to patient leaving prior to being seen by health care provider: Secondary | ICD-10-CM | POA: Diagnosis not present

## 2019-10-28 LAB — COMPREHENSIVE METABOLIC PANEL
ALT: 27 U/L (ref 0–44)
AST: 24 U/L (ref 15–41)
Albumin: 3.8 g/dL (ref 3.5–5.0)
Alkaline Phosphatase: 97 U/L (ref 38–126)
Anion gap: 6 (ref 5–15)
BUN: 13 mg/dL (ref 6–20)
CO2: 28 mmol/L (ref 22–32)
Calcium: 8.8 mg/dL — ABNORMAL LOW (ref 8.9–10.3)
Chloride: 107 mmol/L (ref 98–111)
Creatinine, Ser: 0.81 mg/dL (ref 0.44–1.00)
GFR calc Af Amer: >60 mL/min (ref >60)
GFR calc non Af Amer: >60 mL/min (ref >60)
Glucose, Bld: 107 mg/dL — ABNORMAL HIGH (ref 70–99)
Potassium: 4.2 mmol/L (ref 3.5–5.1)
Sodium: 141 mmol/L (ref 135–145)
Total Bilirubin: 0.4 mg/dL (ref 0.3–1.2)
Total Protein: 7.2 g/dL (ref 6.5–8.1)

## 2019-10-28 LAB — URINALYSIS, COMPLETE (UACMP) WITH MICROSCOPIC
Bacteria, UA: NONE SEEN
Bilirubin Urine: NEGATIVE
Glucose, UA: NEGATIVE mg/dL
Hgb urine dipstick: NEGATIVE
Ketones, ur: NEGATIVE mg/dL
Leukocytes,Ua: NEGATIVE
Nitrite: NEGATIVE
Protein, ur: NEGATIVE mg/dL
Specific Gravity, Urine: 1.008 (ref 1.005–1.030)
Squamous Epithelial / HPF: NONE SEEN (ref 0–5)
pH: 6 (ref 5.0–8.0)

## 2019-10-28 LAB — CBC
HCT: 45.1 % (ref 36.0–46.0)
Hemoglobin: 14.4 g/dL (ref 12.0–15.0)
MCH: 29.7 pg (ref 26.0–34.0)
MCHC: 31.9 g/dL (ref 30.0–36.0)
MCV: 93 fL (ref 80.0–100.0)
Platelets: 199 10*3/uL (ref 150–400)
RBC: 4.85 MIL/uL (ref 3.87–5.11)
RDW: 13 % (ref 11.5–15.5)
WBC: 8 10*3/uL (ref 4.0–10.5)
nRBC: 0 % (ref 0.0–0.2)

## 2019-10-28 LAB — LIPASE, BLOOD: Lipase: 21 U/L (ref 11–51)

## 2019-10-28 NOTE — ED Notes (Signed)
Pt wanting number for dr Chancy Milroy.  informed pt do not have a direct number to doctor but could look up office number.  Pt found number and is up to phone to call her doctor.

## 2019-10-28 NOTE — ED Notes (Signed)
Called pt for repeat vitals, no answer

## 2019-10-28 NOTE — ED Notes (Addendum)
Pt has still not returned. No answer.

## 2019-10-28 NOTE — ED Notes (Signed)
Pt ambulating in lobby without difficulty.

## 2019-10-28 NOTE — ED Notes (Signed)
Pt asking for bus pass to go home.  Informed patient that per charge RN we cannot give her a bus pass as she has not been seen.  Pt states "well you guys got me here how am I supposed to get home, you normally give me a cab".  Explained to patient that the ER cannot give someone a cab pass every time they come to the ED.  Pt states "well the ambulance brought me I need a ticket home".  Also informed patient that she has not been seen by the doctor and that is why unable to give a pass per charge and cannot hand out passes in the lobby to all patients that come in and are not seen.  Pt states "I am going to report this, you already left a needle in my leg, you are on the line".  Explained that this RN did not leave a needle in patients leg but apologized for previous experiences she may have had.  Pt then walked outside.

## 2019-10-28 NOTE — ED Notes (Signed)
Called pt again for vitals. Have not seen pt re-enter ED

## 2019-10-28 NOTE — ED Triage Notes (Signed)
Pt to ER with reports of abdominal pain and urinary incontinence.  Pt states symptoms for last week.  Pt also states back pain.

## 2019-10-28 NOTE — ED Triage Notes (Signed)
FIRST NURSE NOTE- pt here for abdominal pain and incontinence.  Sx are not new but incontinence has been worse.  Arrived EMS. VSS with EMS.

## 2019-11-01 ENCOUNTER — Ambulatory Visit (INDEPENDENT_AMBULATORY_CARE_PROVIDER_SITE_OTHER): Payer: Medicaid Other | Admitting: Podiatry

## 2019-11-01 ENCOUNTER — Encounter: Payer: Medicaid Other | Admitting: Podiatry

## 2019-11-01 ENCOUNTER — Other Ambulatory Visit: Payer: Self-pay

## 2019-11-01 DIAGNOSIS — M2041 Other hammer toe(s) (acquired), right foot: Secondary | ICD-10-CM | POA: Diagnosis not present

## 2019-11-01 DIAGNOSIS — M7752 Other enthesopathy of left foot: Secondary | ICD-10-CM | POA: Diagnosis not present

## 2019-11-01 DIAGNOSIS — M2042 Other hammer toe(s) (acquired), left foot: Secondary | ICD-10-CM | POA: Diagnosis not present

## 2019-11-01 DIAGNOSIS — M7672 Peroneal tendinitis, left leg: Secondary | ICD-10-CM

## 2019-11-01 NOTE — Patient Instructions (Signed)
Pre-Operative Instructions  Congratulations, you have decided to take an important step towards improving your quality of life.  You can be assured that the doctors and staff at [facility] will be with you every step of the way.  Here are some important things you should know:  1. Plan to be at the surgery center/hospital at least 1 (one) hour prior to your scheduled time, unless otherwise directed by the surgical center/hospital staff.  You must have a responsible adult accompany you, remain during the surgery and drive you home.  Make sure you have directions to the surgical center/hospital to ensure you arrive on time. 2. If you are having surgery at [facility] or [facility], you will need a copy of your medical history and physical form from your family physician within one month prior to the date of surgery. We will give you a form for your primary physician to complete.  3. We make every effort to accommodate the date you request for surgery.  However, there are times where surgery dates or times have to be moved.  We will contact you as soon as possible if a change in schedule is required.   4. No aspirin/ibuprofen for one week before surgery.  If you are on aspirin, any non-steroidal anti-inflammatory medications (Mobic, Aleve, Ibuprofen) should not be taken seven (7) days prior to your surgery.  You make take Tylenol for pain prior to surgery.  5. Medications - If you are taking daily heart and blood pressure medications, seizure, reflux, allergy, asthma, anxiety, pain or diabetes medications, make sure you notify the surgery center/hospital before the day of surgery so they can tell you which medications you should take or avoid the day of surgery. 6. No food or drink after midnight the night before surgery unless directed otherwise by surgical center/hospital staff. 7. No alcoholic beverages 24-hours prior to surgery.  No smoking 24-hours prior or 24-hours after  surgery. 8. Wear loose pants or shorts. They should be loose enough to fit over bandages, boots, and casts. 9. Don't wear slip-on shoes. Sneakers are preferred. 10. Bring your boot with you to the surgery center/hospital.  Also bring crutches or a walker if your physician has prescribed it for you.  If you do not have this equipment, it will be provided for you after surgery. 11. If you have not been contacted by the surgery center/hospital by the day before your surgery, call to confirm the date and time of your surgery. 12. Leave-time from work may vary depending on the type of surgery you have.  Appropriate arrangements should be made prior to surgery with your employer. 13. Prescriptions will be provided immediately following surgery by your doctor.  Fill these as soon as possible after surgery and take the medication as directed. Pain medications will not be refilled on weekends and must be approved by the doctor. 14. Remove nail polish on the operative foot and avoid getting pedicures prior to surgery. 15. Wash the night before surgery.  The night before surgery wash the foot and leg well with water and the antibacterial soap provided. Be sure to pay special attention to beneath the toenails and in between the toes.  Wash for at least three (3) minutes. Rinse thoroughly with water and dry well with a towel.  Perform this wash unless told not to do so by your physician.  Enclosed: 1 Ice pack (please put in freezer the night before surgery)   1 Hibiclens skin cleaner     Pre-op instructions  If you have any questions regarding the instructions, please do not hesitate to call our office.  [place]: [address], [place], [place] [address] -- [phone]  [place]: [address] [place], [place] [address] -- [phone]  [place]: [address], [place], [place] [address] -- [phone]   Website: [email or web address] 

## 2019-11-03 ENCOUNTER — Telehealth: Payer: Self-pay | Admitting: Podiatry

## 2019-11-03 NOTE — Telephone Encounter (Signed)
I need to talk to you about the time of my schedule because medicaid gives me rides. I was under the understand that you call me an hour before and that won't work because I have to let them know 3 days in advance or they won't even make the appointment for me to be picked up if they don't have the time, so I need a time. Please just give me a call back. Thank you.

## 2019-11-04 NOTE — Telephone Encounter (Signed)
I'm having some problems setting up my surgery date. Can you please call me back, I would appreciate it. Thank you.

## 2019-11-04 NOTE — Telephone Encounter (Signed)
I am returning your call.  You called [facility].  You need to call the surgical center and ask them about your arrival time.  Their phone number is located on the back of the brochure that we gave you.  "It is?"  Yes, it is.  "I'll give them a call because I have to arrange for transportation.

## 2019-11-04 NOTE — Progress Notes (Signed)
   HPI: 54 y.o. female presenting today for follow up evaluation of pain of the toes and left foot. She states the pain has returned and has worsened. She reports throbbing pain to the bilateral great toes. She states the injections she received in the past helped alleviate her symptoms temporarily. Walking increases her pain. She has been soaking the feet in Epsom salt for treatment. Patient is here for further evaluation and treatment.   Past Medical History:  Diagnosis Date  . ADD (attention deficit disorder)   . Anxiety   . Depression   . Fibromyalgia   . GERD (gastroesophageal reflux disease)   . Neuropathy   . Schizophrenia ([place])   . Scoliosis       Objective: Physical Exam General: The patient is alert and oriented x3 in no acute distress.  Dermatology: Skin is cool, dry and supple bilateral lower extremities. Negative for open lesions or macerations.  Vascular: Palpable pedal pulses bilaterally. No edema or erythema noted. Capillary refill within normal limits.  Neurological: Epicritic and protective threshold grossly intact bilaterally.   Musculoskeletal Exam: All pedal and ankle joints range of motion within normal limits bilateral. Muscle strength 5/5 in all groups bilateral. Hammertoe contracture deformity noted to digits 2-5 of the left foot. Pain with palpation to the insertion of the peroneal tendon of the left foot.  Assessment: 1. 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 2nd MPJ capsulitis left 3. 2nd MPJ dislocation left  4. Insertional peroneal tendinitis / bursitis left    Plan of Care:  1. Patient evaluated.  2. Surgery was modified today to include excision of bursa 5th metatarsal tubercle left.  3. Authorization for surgery was re-initiated today. Surgery will consist of 2nd metatarsal head resection left; PIPJ arthroplasty with MPJ capsulotomy digits 2-5 left; excision of bursa left 5th metatarsal tubercle. Surgery scheduled for February 2021.  4.  Return to clinic one week post op.     Edrick Kins, DPM [facility]  Dr. Edrick Kins, DPM    [address], [place] [address]                Office [phone]  Fax [phone]

## 2019-11-24 ENCOUNTER — Encounter: Payer: Self-pay | Admitting: Podiatry

## 2019-11-24 ENCOUNTER — Other Ambulatory Visit: Payer: Self-pay | Admitting: Podiatry

## 2019-11-24 DIAGNOSIS — M7752 Other enthesopathy of left foot: Secondary | ICD-10-CM | POA: Diagnosis not present

## 2019-11-24 DIAGNOSIS — D492 Neoplasm of unspecified behavior of bone, soft tissue, and skin: Secondary | ICD-10-CM | POA: Diagnosis not present

## 2019-11-24 DIAGNOSIS — M21542 Acquired clubfoot, left foot: Secondary | ICD-10-CM | POA: Diagnosis not present

## 2019-11-24 DIAGNOSIS — M2042 Other hammer toe(s) (acquired), left foot: Secondary | ICD-10-CM | POA: Diagnosis not present

## 2019-11-24 MED ORDER — OXYCODONE-ACETAMINOPHEN 5-325 MG PO TABS: 1.0000 | ORAL_TABLET | Freq: Four times a day (QID) | ORAL | 0 refills | Status: DC | PRN

## 2019-11-24 NOTE — Progress Notes (Signed)
PRN postop 

## 2019-11-25 ENCOUNTER — Telehealth: Payer: Self-pay | Admitting: *Deleted

## 2019-11-25 MED ORDER — HYDROCODONE-ACETAMINOPHEN 10-325 MG PO TABS: 1.0000 | ORAL_TABLET | ORAL | 0 refills | Status: DC | PRN

## 2019-11-25 NOTE — Telephone Encounter (Signed)
Pt states she is allergic to anything morphine and is having severe itching and the surgery foot is covered with blood. I asked pt if the blood on the surgery foot was still wet or dry. Pt states the blood is dry and hard. I told pt that would serve as a splint and protective covering but she would need to stay off the foot as much as possible as to not cause irritation to the surgery site. I told pt to stop the percocet and begin benadryl for the itching, if she should have difficulty breathing, increased swelling to the face, mouth or throat go to the ED. Pt states she is able to take Loratab. I told pt I would send a message to the doctor in office for change of medication.

## 2019-11-30 ENCOUNTER — Telehealth: Payer: Self-pay | Admitting: *Deleted

## 2019-11-30 ENCOUNTER — Encounter: Payer: Medicaid Other | Admitting: Podiatry

## 2019-11-30 NOTE — Telephone Encounter (Signed)
Pt states she got a ride to the office because she thought it was to be in [place], but they said she had to be seen in [place], and they couldn't find it.

## 2019-11-30 NOTE — Telephone Encounter (Signed)
Pt called states she can get a ride and will be here in a hour.

## 2019-11-30 NOTE — Telephone Encounter (Signed)
Pt states she had to redress her surgery foot, the foot is still bleeding and swollen from last Thursday surgery.

## 2019-11-30 NOTE — Telephone Encounter (Signed)
I called pt, she states the foot is very red and swollen at the suture line and painful. Dr. Amalia Hailey states he would like to see her today. I told pt that she needed to come in today. Pt states she will call to see if she could get a ride, or she will go to a urgent care. I told pt to call and let me know if she could come in today, and if she couldn't go to the urgent care.

## 2019-12-02 ENCOUNTER — Ambulatory Visit (INDEPENDENT_AMBULATORY_CARE_PROVIDER_SITE_OTHER): Payer: Medicaid Other | Admitting: Podiatry

## 2019-12-02 ENCOUNTER — Encounter: Payer: Self-pay | Admitting: Podiatry

## 2019-12-02 ENCOUNTER — Ambulatory Visit (INDEPENDENT_AMBULATORY_CARE_PROVIDER_SITE_OTHER): Payer: Medicaid Other

## 2019-12-02 ENCOUNTER — Other Ambulatory Visit: Payer: Self-pay

## 2019-12-02 VITALS — BP 125/80 | HR 76 | Temp 98.0°F

## 2019-12-02 DIAGNOSIS — M2042 Other hammer toe(s) (acquired), left foot: Secondary | ICD-10-CM | POA: Diagnosis not present

## 2019-12-02 DIAGNOSIS — Z9889 Other specified postprocedural states: Secondary | ICD-10-CM

## 2019-12-02 MED ORDER — DOXYCYCLINE HYCLATE 100 MG PO TABS: 100.0000 mg | ORAL_TABLET | Freq: Two times a day (BID) | ORAL | 0 refills | Status: DC

## 2019-12-02 MED ORDER — HYDROCODONE-ACETAMINOPHEN 10-325 MG PO TABS: 1.0000 | ORAL_TABLET | ORAL | 0 refills | Status: DC | PRN

## 2019-12-06 NOTE — Progress Notes (Signed)
   Subjective:  Patient presents today status post hammertoe repair digits 2-5 left. DOS: 11/24/2019. She reports pain of the foot. She reports associated redness and swelling and believes the fourth digit has purulent drainage. She admits to getting the foot wet in the shower this morning. She has been using the CAM boot as directed. She denies modifying factors at this time. Patient is here for further evaluation and treatment.    Past Medical History:  Diagnosis Date  . ADD (attention deficit disorder)   . Anxiety   . Depression   . Fibromyalgia   . GERD (gastroesophageal reflux disease)   . Neuropathy   . Schizophrenia ([place])   . Scoliosis       Objective/Physical Exam Neurovascular status intact.  Skin incisions appear to be well coapted with sutures and staples intact. No sign of infectious process noted. No dehiscence. No active bleeding noted. Moderate edema noted to the surgical extremity.  Radiographic Exam:  Orthopedic hardware and osteotomies sites appear to be stable with routine healing.  Assessment: 1. s/p hammertoe repair digits 2-5 left. DOS: 11/24/2019   Plan of Care:  1. Patient was evaluated. X-rays reviewed 2. Dressing changed. Keep clean, dry and intact for one week.  3. Continue weightbearing in CAM boot.  4. Prescription for Doxycycline 100 mg #20 provided to patient.  5. Return to clinic in one week.    Edrick Kins, DPM [facility]  Dr. Edrick Kins, [place]                                        [place], [place] [address]                Office [phone]  Fax [phone]

## 2019-12-09 ENCOUNTER — Ambulatory Visit (INDEPENDENT_AMBULATORY_CARE_PROVIDER_SITE_OTHER): Payer: Medicaid Other | Admitting: Podiatry

## 2019-12-09 ENCOUNTER — Other Ambulatory Visit: Payer: Self-pay

## 2019-12-09 DIAGNOSIS — Z9889 Other specified postprocedural states: Secondary | ICD-10-CM

## 2019-12-09 DIAGNOSIS — M2042 Other hammer toe(s) (acquired), left foot: Secondary | ICD-10-CM

## 2019-12-09 MED ORDER — HYDROCODONE-ACETAMINOPHEN 10-325 MG PO TABS: 1.0000 | ORAL_TABLET | ORAL | 0 refills | Status: DC | PRN

## 2019-12-12 NOTE — Progress Notes (Signed)
   Subjective:  Patient presents today status post hammertoe repair digits 2-5 left. DOS: 11/24/2019. She reports constant stinging pain. She reports associated redness and swelling. She has been using the CAM boot as directed. Patient is here for further evaluation and treatment.    Past Medical History:  Diagnosis Date  . ADD (attention deficit disorder)   . Anxiety   . Depression   . Fibromyalgia   . GERD (gastroesophageal reflux disease)   . Neuropathy   . Schizophrenia ([place])   . Scoliosis       Objective/Physical Exam Neurovascular status intact.  Skin incisions appear to be well coapted with sutures and staples intact. No sign of infectious process noted. No dehiscence. No active bleeding noted. Moderate edema noted to the surgical extremity.  Assessment: 1. s/p hammertoe repair digits 2-5 left. DOS: 11/24/2019   Plan of Care:  1. Patient was evaluated.  2. Partial staples removed.  3. Continue weightbearing in CAM boot.  4. Refill prescription for Vicodin 10/325 mg provided to patient.  5. Finish oral Doxycycline as prescribed.  6. Return to clinic in 9 days.     Edrick Kins, DPM [facility]  Dr. Edrick Kins, [place]                                        [place], [place] [address]                Office [phone]  Fax [phone]

## 2019-12-23 ENCOUNTER — Ambulatory Visit: Payer: Medicaid Other | Admitting: Podiatry

## 2019-12-23 ENCOUNTER — Other Ambulatory Visit: Payer: Self-pay

## 2019-12-23 DIAGNOSIS — Z9889 Other specified postprocedural states: Secondary | ICD-10-CM

## 2019-12-23 DIAGNOSIS — M2042 Other hammer toe(s) (acquired), left foot: Secondary | ICD-10-CM

## 2019-12-27 NOTE — Progress Notes (Signed)
   Subjective:  Patient presents today status post hammertoe repair digits 2-5 left. DOS: 11/24/2019. She states she is doing well. She reports some swelling of the foot. She denies modifying factors. Patient is here for further evaluation and treatment.    Past Medical History:  Diagnosis Date  . ADD (attention deficit disorder)   . Anxiety   . Depression   . Fibromyalgia   . GERD (gastroesophageal reflux disease)   . Neuropathy   . Schizophrenia ([place])   . Scoliosis       Objective/Physical Exam Neurovascular status intact.  Skin incisions appear to be well coapted. No sign of infectious process noted. No dehiscence. No active bleeding noted. Moderate edema noted to the surgical extremity.  Assessment: 1. s/p hammertoe repair digits 2-5 left. DOS: 11/24/2019   Plan of Care:  1. Patient was evaluated.  2. Staples removed.  3. Percutaneous pins removed.  4. Discontinue using CAM boot.  5. Post op shoe dispensed.  6. Return to clinic in 3 weeks.      Edrick Kins, DPM [facility]  Dr. Edrick Kins, [place]                                        [place], [place] [address]                Office [phone]  Fax [phone]

## 2019-12-28 ENCOUNTER — Telehealth: Payer: Self-pay | Admitting: Podiatry

## 2019-12-28 NOTE — Telephone Encounter (Signed)
Pt called to ask when her swelling will go down her foot and toes are so swollen and shes having pain

## 2019-12-28 NOTE — Telephone Encounter (Signed)
I tried to return call to patient and voice mail is not set up yet.

## 2020-01-06 ENCOUNTER — Telehealth: Payer: Self-pay | Admitting: *Deleted

## 2020-01-06 NOTE — Telephone Encounter (Signed)
Pt called asked when the swelling in her toes is going to go down.

## 2020-01-06 NOTE — Telephone Encounter (Signed)
I returned patients call, she wanted to know how long her foot would swell.  I asked if she has been on her foot a lot and she stated "a lot more that I probably should be"  I informed her that she is only 6 weeks out from surgery since she is on her foot a lot, it will swell.  She decline much pain.  I instructed her to try and prop foot above heart when foot swells and [place] ice as needed and call with any other concerns.  She verbalized instructions

## 2020-01-06 NOTE — Telephone Encounter (Deleted)
I returned patients call, she wanted to know how long her foot would swell.  I asked if she has been on her foot a lot and she stated "a lot more that I probably should be"  I informed her that she is only because of her being on her foot a lot will cause swelling and

## 2020-01-20 ENCOUNTER — Encounter: Payer: Medicaid Other | Admitting: Podiatry

## 2020-01-20 ENCOUNTER — Ambulatory Visit: Payer: Medicaid Other

## 2020-01-20 DIAGNOSIS — Z9889 Other specified postprocedural states: Secondary | ICD-10-CM

## 2020-01-20 DIAGNOSIS — M2042 Other hammer toe(s) (acquired), left foot: Secondary | ICD-10-CM

## 2020-01-29 IMAGING — CR LUMBAR SPINE - 2-3 VIEW
1 series · 3 of 3 positions shown · non-contrast
Comparison: 04/19/2018

CLINICAL DATA: 52-year-old female with a history of back injury

EXAM:
LUMBAR SPINE - 2-3 VIEW

[Series 1: dg lumbar spine 2-3 views · 0.14mm/px · 3 of 3 slices shown]
[im 1/3]
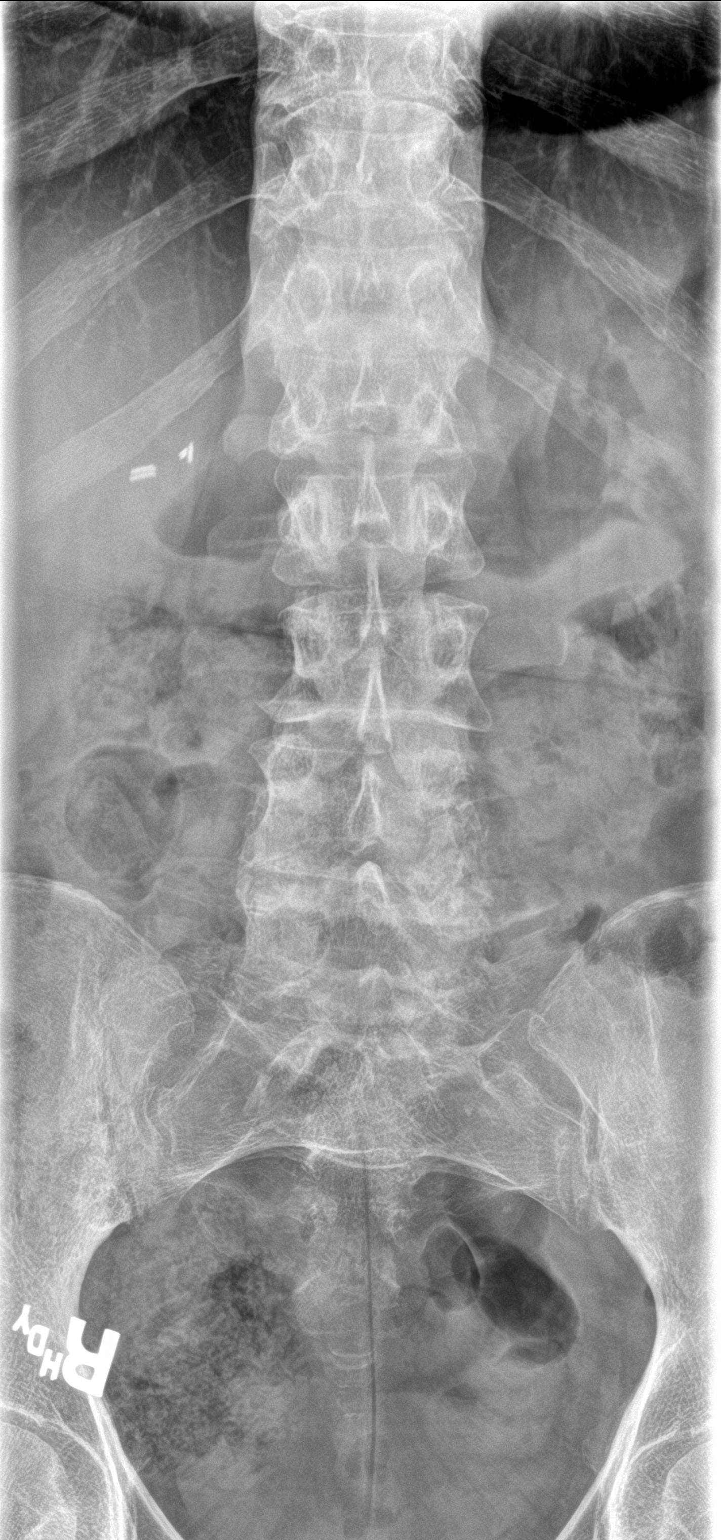
[im 2/3]
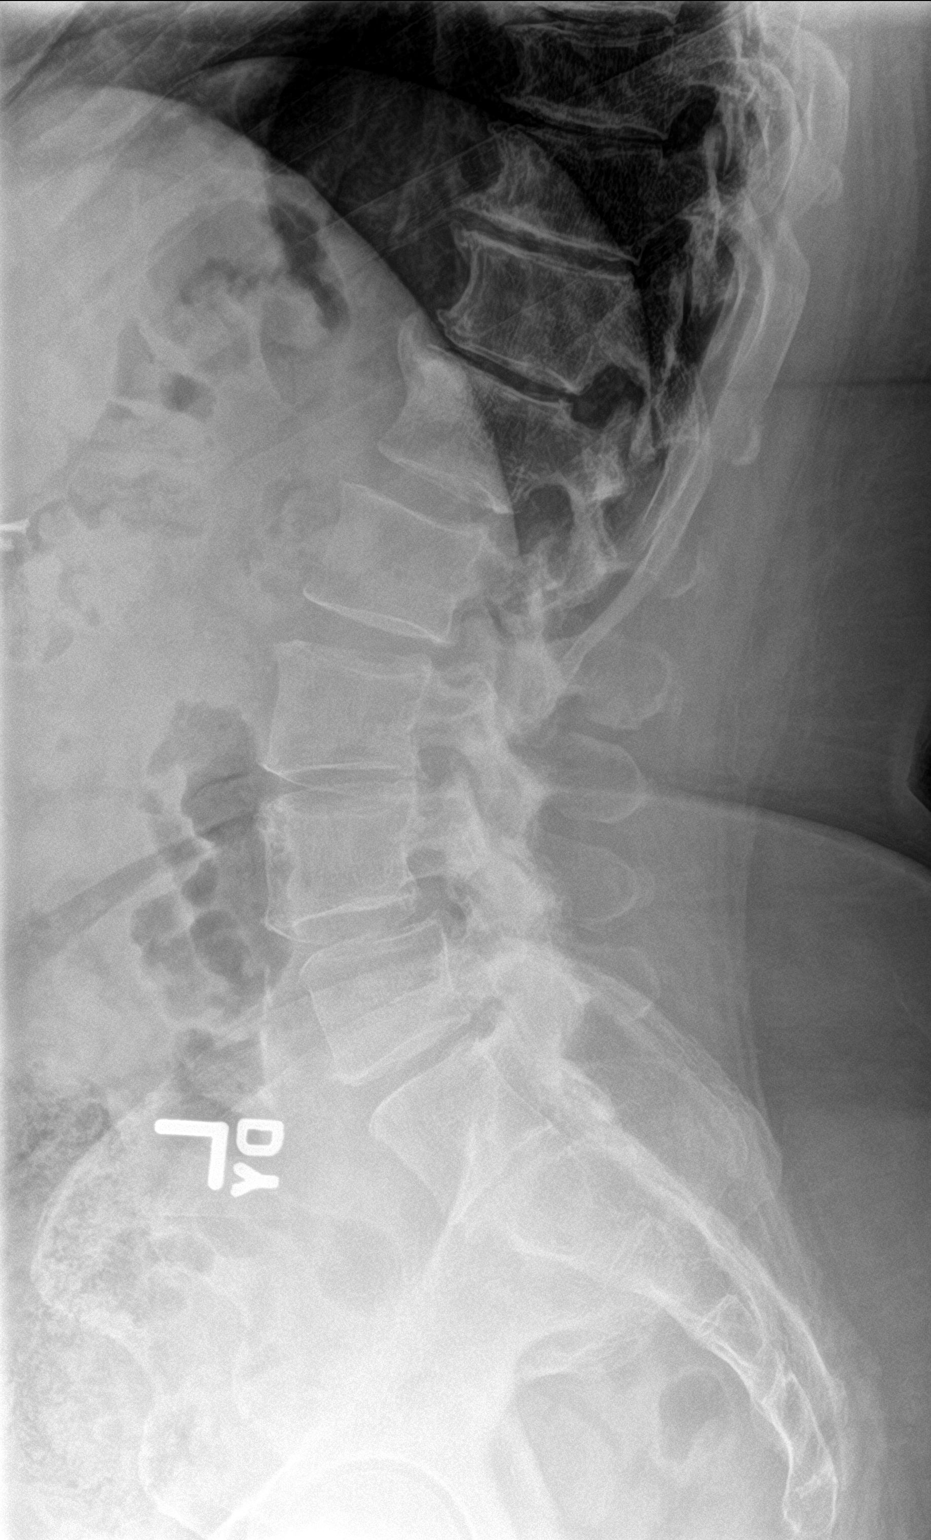
[im 3/3]
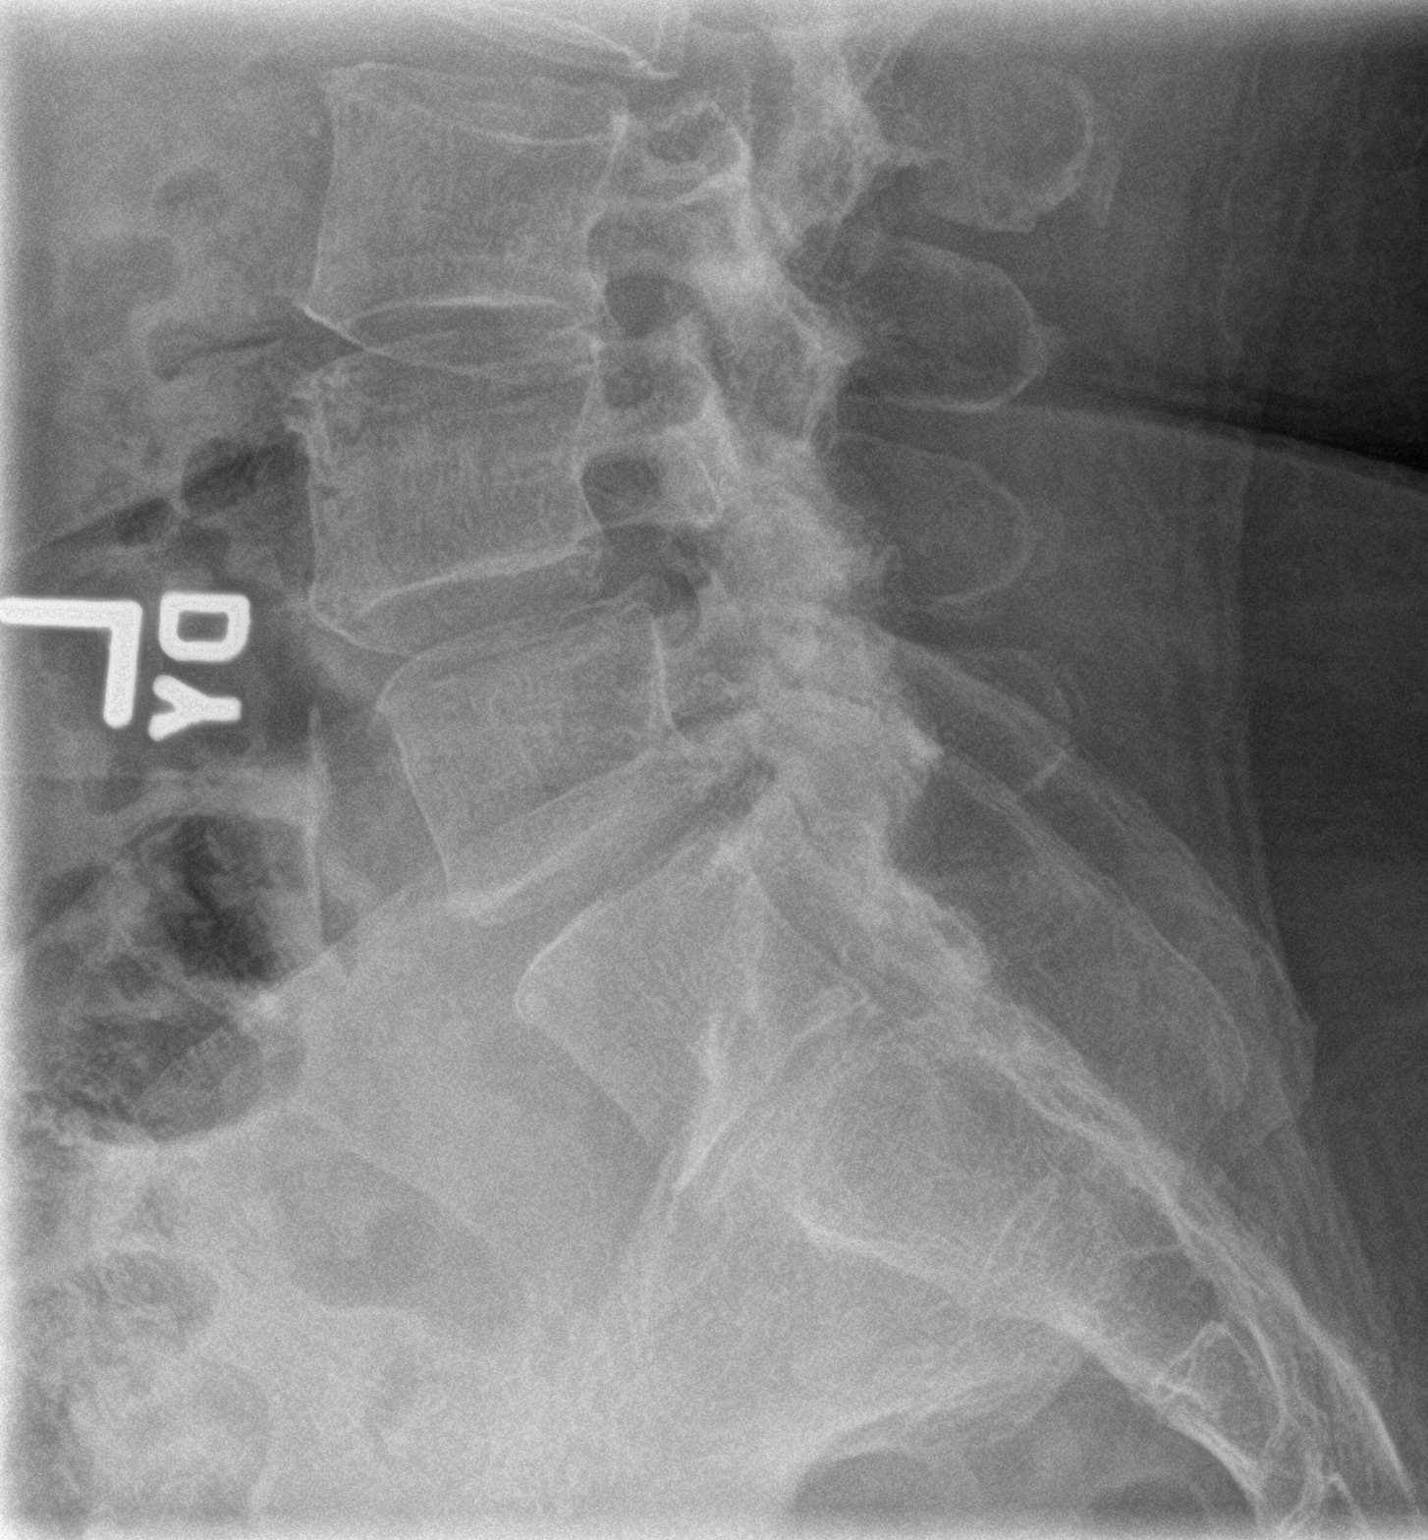

[3 of 3 positions shown; findings below may reference images not displayed]

FINDINGS: Lumbar Spine:

Similar degree of anterolisthesis of L4 on L5, approximately 4-5
mm. Redemonstration of what is a presumed S1 level that is
partially lumbarized.

No acute fracture line identified.

Vertebral body heights maintained.

Disc space narrowing similar to the prior, greatest in the lower
thoracic region, and within the lumbar region at L5-S1.

Facet hypertrophy at L4-L5 and L5-S1.

Unremarkable appearance of the visualized abdomen.
IMPRESSION: Negative for acute fracture or malalignment of the lumbar spine.

Similar degree of anterolisthesis of L4 on L5.

Facet hypertrophy worst at L4-L5 and L5-S1. Similar appearance of
degenerative disc disease.

## 2020-02-06 ENCOUNTER — Telehealth: Payer: Self-pay | Admitting: Urology

## 2020-02-06 NOTE — Telephone Encounter (Signed)
[facility] left vm regarding pts incontinence supply they have faxed over a form for [place] to fill out and the part for medical necessary was blank he needs [place] to fill it out and sign and date so pt can receive her supplies please call [phone] with any questions

## 2020-02-09 NOTE — Telephone Encounter (Signed)
Faxed again 02/09/2020 @ 1:31 pm

## 2020-02-09 NOTE — Telephone Encounter (Signed)
[facility] calling again stating that they received the form back and it was blank, please advise

## 2020-02-21 ENCOUNTER — Encounter: Payer: Medicaid Other | Admitting: Podiatry

## 2020-02-21 ENCOUNTER — Ambulatory Visit: Payer: Medicaid Other

## 2020-02-28 ENCOUNTER — Encounter: Payer: Self-pay | Admitting: Podiatry

## 2020-02-28 ENCOUNTER — Other Ambulatory Visit: Payer: Self-pay

## 2020-02-28 ENCOUNTER — Ambulatory Visit (INDEPENDENT_AMBULATORY_CARE_PROVIDER_SITE_OTHER): Payer: Medicaid Other

## 2020-02-28 ENCOUNTER — Ambulatory Visit: Payer: Medicaid Other | Admitting: Podiatry

## 2020-02-28 DIAGNOSIS — M2042 Other hammer toe(s) (acquired), left foot: Secondary | ICD-10-CM

## 2020-02-28 DIAGNOSIS — Z9889 Other specified postprocedural states: Secondary | ICD-10-CM

## 2020-02-28 DIAGNOSIS — G629 Polyneuropathy, unspecified: Secondary | ICD-10-CM

## 2020-02-28 NOTE — Progress Notes (Signed)
   Subjective:  Patient presents today status post hammertoe repair digits 2-5 left. DOS: 11/24/2019.  Patient states that she is doing very well.  Her toes do feel tight at times however this is minimal.  She continues to experience the neuropathy to her bilateral lower extremities.  She is currently being managed by pain management.  No new complaints at this time.    Past Medical History:  Diagnosis Date  . ADD (attention deficit disorder)   . Anxiety   . Depression   . Fibromyalgia   . GERD (gastroesophageal reflux disease)   . Neuropathy   . Schizophrenia ([place])   . Scoliosis       Objective/Physical Exam Physical Exam General: The patient is alert and oriented x3 in no acute distress.  Dermatology: Skin is cool, dry and supple bilateral lower extremities. Negative for open lesions or macerations.  Vascular: Palpable pedal pulses bilaterally. No edema or erythema noted. Capillary refill within normal limits.  Neurological: Epicritic and protective threshold diminished bilaterally.   Musculoskeletal Exam: All pedal and ankle joints range of motion within normal limits bilateral. Muscle strength 5/5 in all groups bilateral.  There is some limited range of motion to the toes of the left foot due to the surgical repair of the hammertoes.  Otherwise they are in a rectus position and significantly improved from prior to surgery    Assessment: 1. s/p hammertoe repair digits 2-5 left. DOS: 11/24/2019 2.  Peripheral polyneuropathy bilateral lower extremities 3.  H/o fibromyalgia   Plan of Care:  1. Patient was evaluated.  2.  From a podiatry/surgical standpoint the patient can resume full activity no with no restrictions. 3.  Recommend good supportive shoes 4.  Continue gabapentin 600 mg 3 times daily as per pain management 5.  Return to clinic as needed   Edrick Kins, DPM [facility]  Dr. Edrick Kins, [address]                                         [place], [place] [address]                Office [phone]  Fax [phone]

## 2020-03-06 NOTE — Progress Notes (Signed)
This encounter was created in error - please disregard.

## 2020-04-13 ENCOUNTER — Ambulatory Visit: Payer: Medicaid Other | Admitting: Podiatry

## 2020-06-22 ENCOUNTER — Ambulatory Visit: Payer: Medicaid Other | Admitting: Podiatry

## 2020-08-17 ENCOUNTER — Emergency Department: Payer: Medicaid Other

## 2020-08-17 ENCOUNTER — Encounter: Payer: Self-pay | Admitting: *Deleted

## 2020-08-17 ENCOUNTER — Emergency Department
Admission: EM | Admit: 2020-08-17 | Discharge: 2020-08-17 | Disposition: A | Discharge status: Home / Self Care | Payer: Medicaid Other | Attending: Emergency Medicine | Admitting: Emergency Medicine

## 2020-08-17 ENCOUNTER — Other Ambulatory Visit: Payer: Self-pay

## 2020-08-17 DIAGNOSIS — Z96653 Presence of artificial knee joint, bilateral: Secondary | ICD-10-CM | POA: Insufficient documentation

## 2020-08-17 DIAGNOSIS — G8929 Other chronic pain: Secondary | ICD-10-CM | POA: Insufficient documentation

## 2020-08-17 DIAGNOSIS — M25512 Pain in left shoulder: Secondary | ICD-10-CM | POA: Diagnosis not present

## 2020-08-17 DIAGNOSIS — M545 Low back pain, unspecified: Secondary | ICD-10-CM | POA: Diagnosis present

## 2020-08-17 DIAGNOSIS — M79602 Pain in left arm: Secondary | ICD-10-CM | POA: Insufficient documentation

## 2020-08-17 DIAGNOSIS — F17200 Nicotine dependence, unspecified, uncomplicated: Secondary | ICD-10-CM | POA: Diagnosis not present

## 2020-08-17 MED ORDER — KETOROLAC TROMETHAMINE 10 MG PO TABS: 10.0000 mg | ORAL_TABLET | Freq: Four times a day (QID) | ORAL | 0 refills | Status: AC | PRN

## 2020-08-17 MED ORDER — KETOROLAC TROMETHAMINE 60 MG/2ML IM SOLN
30.0000 mg | Freq: Once | INTRAMUSCULAR | Status: AC
  Administered 2020-08-17: 30 mg via INTRAMUSCULAR
  Filled 2020-08-17: qty 2

## 2020-08-17 MED ORDER — METHOCARBAMOL 750 MG PO TABS: 750.0000 mg | ORAL_TABLET | Freq: Four times a day (QID) | ORAL | 0 refills | Status: AC | PRN

## 2020-08-17 MED ORDER — METHOCARBAMOL 500 MG PO TABS
750.0000 mg | ORAL_TABLET | Freq: Once | ORAL | Status: AC
  Administered 2020-08-17: 750 mg via ORAL
  Filled 2020-08-17: qty 2

## 2020-08-17 NOTE — ED Triage Notes (Addendum)
Patient c/o chronic lumbar pain and new onset left arm pain. Patient states she is waiting for an appointment to [facility] so she can get a referral to a pain clinic.

## 2020-08-17 NOTE — ED Notes (Signed)
Patient given warm blanket and pillow °

## 2020-08-18 NOTE — ED Provider Notes (Signed)
[facility] Emergency Department Provider Note  ____________________________________________   First MD Initiated Contact with Patient 08/17/20 1904     (approximate)  I have reviewed the triage vital signs and the nursing notes.   HISTORY  Chief Complaint Back Pain  HPI Monique Schaefer is a 54 y.o. female presents emergency department for evaluation of acute on chronic low back pain as well as left arm pain.  The patient states that neither 1 of these were the result of an acute or new trauma.  She states that her left shoulder has been bothering her for 1 to 2 months, she cannot lift it over her head without significant pain.  She denies paresthesias, weakness in the arm.  She is most concerned about her acute low back pain.  She has longstanding history of low back pain, states that she is not currently under the care of a pain management clinic.  She is tearful and states that she had a worsening of the pain over the last week.  She states "she cannot take this pain any longer".  She denies any loss of bowel or bladder control, denies saddle anesthesia, denies weakness down either leg.  Pain is rated 10/10.  She states she has tried treating with ibuprofen and Tylenol without any improvement.       Past Medical History:  Diagnosis Date  . ADD (attention deficit disorder)   . Anxiety   . Depression   . Fibromyalgia   . GERD (gastroesophageal reflux disease)   . Neuropathy   . Schizophrenia ([place])   . Scoliosis     Patient Active Problem List   Diagnosis Date Noted  . Cervical lymphadenopathy 06/08/2018  . Chronic active hepatitis ([place]) 06/08/2018  . Bilateral foot pain 05/04/2018  . Neuropathy 05/04/2018  . Elevated TSH 05/04/2018  . Exposure to hepatitis C 05/04/2018  . Back pain 03/04/2018  . Health care maintenance 03/04/2018  . 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 Cocaine abuse ([place]) 02/21/2018  .  Cannabis abuse 02/21/2018  . Homelessness 02/21/2018    Past Surgical History:  Procedure Laterality Date  . BILATERAL CARPAL TUNNEL RELEASE Bilateral   . CHOLECYSTECTOMY    . FEMUR SURGERY Left   . JOINT REPLACEMENT    . REPLACEMENT TOTAL KNEE BILATERAL Bilateral     Prior to Admission medications   Medication Sig Start Date End Date Taking? Authorizing Provider  albuterol (VENTOLIN HFA) 108 (90 Base) MCG/ACT inhaler Inhale 2 puffs into the lungs every 6 (six) hours as needed for wheezing or shortness of breath. 08/15/19   Laban Emperor, PA-C  clonazePAM (KLONOPIN) 0.5 MG tablet Take 0.5 mg by mouth 2 (two) times daily as needed. 10/11/19   [provider]  clotrimazole-betamethasone (LOTRISONE) cream Apply 1 application topically 2 (two) times daily. 03/08/19   Fisher, Linden Dolin, PA-C  doxepin (SINEQUAN) 50 MG capsule Take 50-100 mg by mouth at bedtime. 08/01/19   [provider]  doxycycline (VIBRA-TABS) 100 MG tablet Take 1 tablet (100 mg total) by mouth 2 (two) times daily. 12/02/19   Edrick Kins, DPM  gabapentin (NEURONTIN) 400 MG capsule Take 1 capsule (400 mg total) by mouth 3 (three) times daily. 01/04/19   Iloabachie, Chioma E, NP  HYDROcodone-acetaminophen (NORCO) 10-325 MG tablet Take 1 tablet by mouth every 4 (four) hours as needed. 12/09/19   Edrick Kins, DPM  hydrOXYzine (ATARAX/VISTARIL) 10 MG tablet Take 1 tablet (10 mg  total) by mouth 3 (three) times daily as needed. 03/08/19   Fisher, Linden Dolin, PA-C  ketorolac (TORADOL) 10 MG tablet Take 1 tablet (10 mg total) by mouth every 6 (six) hours as needed for up to 5 days. 08/17/20 08/22/20  Marlana Salvage, PA  meloxicam (MOBIC) 15 MG tablet Take 1 tablet (15 mg total) by mouth daily. 09/02/19   Edrick Kins, DPM  methocarbamol (ROBAXIN-750) 750 MG tablet Take 1 tablet (750 mg total) by mouth 4 (four) times daily as needed for up to 10 days for muscle spasms. 08/17/20 08/27/20  Marlana Salvage, PA    methylPREDNISolone (MEDROL DOSEPAK) 4 MG TBPK tablet 6 day dose pack - take as directed 10/07/19   Edrick Kins, DPM  mirabegron ER (MYRBETRIQ) 50 MG TB24 tablet Myrbetriq 50 MG Oral Tablet Extended Release 24 Hour QTY: 30 tablet Days: 30 Refills: 6  Written: 09/02/19 Patient Instructions: take one tablet by mouth once daily 09/02/19 03/30/20  [provider]  oxybutynin (DITROPAN) 5 MG tablet Take 5 mg by mouth daily. 10/03/19   [provider]  pantoprazole (PROTONIX) 40 MG tablet Take 40 mg by mouth daily. 09/21/19   [provider]  VRAYLAR capsule  10/11/19   [provider]    Allergies Paxil [paroxetine hcl], Mango flavor, Morphine and related, Percocet [oxycodone-acetaminophen], Tramadol, and Trazodone and nefazodone  No family history on file.  Social History Social History   Tobacco Use  . Smoking status: Current Every Day Smoker    Packs/day: 0.50    Years: 15.00    Pack years: 7.50  . Smokeless tobacco: Never Used  Vaping Use  . Vaping Use: Never used  Substance Use Topics  . Alcohol use: Not Currently  . Drug use: Not Currently    Types: Marijuana    Review of Systems Constitutional: No fever/chills Eyes: No visual changes. ENT: No sore throat. Cardiovascular: Denies chest pain. Respiratory: Denies shortness of breath. Gastrointestinal: No abdominal pain.  No nausea, no vomiting.  No diarrhea.  No constipation. Genitourinary: Negative for dysuria. Musculoskeletal: + back pain, + left shoulder pain. Skin: Negative for rash. Neurological: Negative for headaches, focal weakness or numbness.   ____________________________________________   PHYSICAL EXAM:  VITAL SIGNS: ED Triage Vitals  Enc Vitals Group     BP 08/17/20 1853 114/87     Pulse Rate 08/17/20 1853 90     Resp 08/17/20 1853 18     Temp 08/17/20 1853 98.4 F (36.9 C)     Temp Source 08/17/20 1853 Oral     SpO2 08/17/20 1853 98 %     Weight 08/17/20 1855 260 lb  (117.9 kg)     Height 08/17/20 1855 5\' 7"  (1.702 m)     Head Circumference --      Peak Flow --      Pain Score 08/17/20 1855 10     Pain Loc --      Pain Edu? --      Excl. in [place]? --     Constitutional: Alert and oriented. Well appearing and in no acute distress. Eyes: Conjunctivae are normal. PERRL. EOMI. Head: Atraumatic. Nose: No congestion/rhinnorhea. Mouth/Throat: Mucous membranes are moist.   Neck: No stridor.   Cardiovascular: Normal rate, regular rhythm. Grossly normal heart sounds.  Good peripheral circulation. Respiratory: Normal respiratory effort.  No retractions. Lungs CTAB. Musculoskeletal: There is tenderness to palpation of the acromion and glenohumeral region of the left shoulder joint.  Patient allows  this for active and passive range of motion if kept under 90 degrees.  Patient has limited and painful range of motion in flexion and abduction when greater than 90 degrees.  Patient also has painful external rotation.  Radial pulses 2+, no weakness appreciated.  Exam of the low back reveals tenderness over the bilateral paraspinal region, no midline tenderness.  The patient has 5/5 strength in ankle plantarflexion, dorsiflexion, knee flexion and extension, hip flexion bilaterally. Neurologic:  Normal speech and language. No gross focal neurologic deficits are appreciated. No gait instability. Skin:  Skin is warm, dry and intact. No rash noted. Psychiatric: Mood and affect are normal. Speech and behavior are normal.  ____________________________________________  RADIOLOGY I, Marlana Salvage, personally viewed and evaluated these images (plain radiographs) as part of my medical decision making, as well as reviewing the written report by the radiologist.  ED provider interpretation: X-rays of the left shoulder reveal arthritic changes without any acute fracture.  Official radiology report(s): DG Shoulder Left  Result Date: 08/17/2020 CLINICAL DATA:  Left shoulder  pain EXAM: LEFT SHOULDER - 2+ VIEW COMPARISON:  None. FINDINGS: There is no evidence of fracture or dislocation. 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 No overlying soft tissue swelling. IMPRESSION: No acute osseous abnormality. Electronically Signed   By: Prudencio Pair M.D.   On: 08/17/2020 20:16    ____________________________________________   INITIAL IMPRESSION / ASSESSMENT AND PLAN / ED COURSE  As part of my medical decision making, I reviewed the following data within the [place] notes reviewed and incorporated, Radiograph reviewed and Notes from prior ED visits        Patient is a 54 year old female who presents emergency department with acute on chronic low back pain and left shoulder pain.  Review of the patient's chart reveals that she has been seen for both of these extensively before.  There was no acute trauma or fall to precipitate a worsening of the pain.  Patient states that she is awaiting referral to a pain clinic.  Review of the patient's chart also reveals that she was seen in July of this year by an orthopedic provider in [facility] for complaint of bilateral shoulder pain, diagnosed with glenohumeral arthritis at that time.  X-rays of the left shoulder were completed in our system for further evaluation, reaffirms arthritic change.  No x-rays of the lumbar spine were completed today given no trauma, no red flag symptoms of cauda equina syndrome.  We will treat the patient with Toradol as well as muscle relaxants until she is able to get it with pain clinic.  The patient is amenable with this plan will follow up with PCP.  She will return to the emergency department with any worsening.      ____________________________________________   FINAL CLINICAL IMPRESSION(S) / ED DIAGNOSES  Final diagnoses:  Chronic bilateral low back pain without sciatica  Chronic left shoulder pain     ED Discharge  Orders         Ordered    methocarbamol (ROBAXIN-750) 750 MG tablet  4 times daily PRN        08/17/20 2039    ketorolac (TORADOL) 10 MG tablet  Every 6 hours PRN        08/17/20 2039          *Please note:  Aalyiah Camberos was evaluated in Emergency Department on 08/18/2020 for the symptoms described in the history of present illness. She was  evaluated in the context of the global COVID-19 pandemic, which necessitated consideration that the patient might be at risk for infection with the SARS-CoV-2 virus that causes COVID-19. Institutional protocols and algorithms that pertain to the evaluation of patients at risk for COVID-19 are in a state of rapid change based on information released by regulatory bodies including the CDC and federal and state organizations. These policies and algorithms were followed during the patient's care in the ED.  Some ED evaluations and interventions may be delayed as a result of limited staffing during and the pandemic.*   Note:  This document was prepared using Dragon voice recognition software and may include unintentional dictation errors.    Marlana Salvage, PA 08/18/20 4997    Vladimir Crofts, MD 08/23/20 [phone]

## 2020-09-07 ENCOUNTER — Other Ambulatory Visit: Payer: Self-pay

## 2020-09-07 ENCOUNTER — Other Ambulatory Visit: Payer: Self-pay | Admitting: Podiatry

## 2020-09-07 ENCOUNTER — Encounter: Payer: Self-pay | Admitting: Podiatry

## 2020-09-07 ENCOUNTER — Ambulatory Visit: Payer: Medicaid Other | Admitting: Podiatry

## 2020-09-07 ENCOUNTER — Ambulatory Visit (INDEPENDENT_AMBULATORY_CARE_PROVIDER_SITE_OTHER): Payer: Medicaid Other

## 2020-09-07 DIAGNOSIS — M2041 Other hammer toe(s) (acquired), right foot: Secondary | ICD-10-CM

## 2020-09-07 DIAGNOSIS — G629 Polyneuropathy, unspecified: Secondary | ICD-10-CM

## 2020-09-07 MED ORDER — TRAMADOL HCL 50 MG PO TABS: 50.0000 mg | ORAL_TABLET | ORAL | 0 refills | Status: DC | PRN

## 2020-09-07 NOTE — Progress Notes (Signed)
   HPI: 54 y.o. female presenting today for new complaint regarding painful symptomatic hammertoes to the second and third digits of the right foot.  Patient has history of hammertoe repair to the left foot and she is doing very well.  She states that the right second and third toes are very sore and painful due to the hammertoes.  She would like to consider surgical correction of the hammertoes today. Patient also states that she has chronic peripheral neuropathy with pins-and-needles and burning sensations.  She takes the gabapentin with minimal relief.  She presents for further treatment and evaluation  Past Medical History:  Diagnosis Date  . ADD (attention deficit disorder)   . Anxiety   . Depression   . Fibromyalgia   . GERD (gastroesophageal reflux disease)   . Neuropathy   . Schizophrenia ([place])   . Scoliosis      Physical Exam: General: The patient is alert and oriented x3 in no acute distress.  Dermatology: Skin is warm, dry and supple bilateral lower extremities. Negative for open lesions or macerations.  Vascular: Palpable pedal pulses bilaterally. No edema or erythema noted. Capillary refill within normal limits.  Neurological: Epicritic and protective threshold diminished bilaterally.   Musculoskeletal Exam: Range of motion within normal limits to all pedal and ankle joints bilateral. Muscle strength 5/5 in all groups bilateral.  Hammertoe contracture second and third digits with slight toe deformity  Radiographic Exam:  Hammertoe contracture noted to the second and third digits of the right foot.  There is some elongation of the metatarsal parabola also noted.  Assessment: 1.  Hammertoes two, three right foot Two.  Peripheral polyneuropathy    Plan of Care:  1. Patient evaluated. X-Rays reviewed.  2. Today we discussed the conservative versus surgical management of the presenting pathology. The patient opts for surgical management. All possible complications and  details of the procedure were explained. All patient questions were answered. No guarantees were expressed or implied. 3. Authorization for surgery was initiated today. Surgery will consist of PIPJ arthroplasty with MTPJ capsulotomy second and third right.  Possible Weil osteotomy second third right. Four.  Continue gabapentin as per PCP Five.  Prescription for tramadol 50 mg every 4 hours as needed pain Six.  Return to clinic 1 week postop        Edrick Kins, DPM [facility]  Dr. Edrick Kins, DPM    [address], [place] [address]                Office [phone]  Fax [phone]

## 2020-09-07 NOTE — Patient Instructions (Signed)
Pre-Operative Instructions  Congratulations, you have decided to take an important step towards improving your quality of life.  You can be assured that the doctors and staff at [facility] will be with you every step of the way.  Here are some important things you should know:  1. Plan to be at the surgery center/hospital at least 1 (one) hour prior to your scheduled time, unless otherwise directed by the surgical center/hospital staff.  You must have a responsible adult accompany you, remain during the surgery and drive you home.  Make sure you have directions to the surgical center/hospital to ensure you arrive on time. 2. If you are having surgery at [facility] or [facility] hospitals, you will need a copy of your medical history and physical form from your family physician within one month prior to the date of surgery. We will give you a form for your primary physician to complete.  3. We make every effort to accommodate the date you request for surgery.  However, there are times where surgery dates or times have to be moved.  We will contact you as soon as possible if a change in schedule is required.   4. No aspirin/ibuprofen for one week before surgery.  If you are on aspirin, any non-steroidal anti-inflammatory medications (Mobic, Aleve, Ibuprofen) should not be taken seven (7) days prior to your surgery.  You make take Tylenol for pain prior to surgery.  5. Medications - If you are taking daily heart and blood pressure medications, seizure, reflux, allergy, asthma, anxiety, pain or diabetes medications, make sure you notify the surgery center/hospital before the day of surgery so they can tell you which medications you should take or avoid the day of surgery. 6. No food or drink after midnight the night before surgery unless directed otherwise by surgical center/hospital staff. 7. No alcoholic beverages 24-hours prior to surgery.  No smoking 24-hours prior or 24-hours after  surgery. 8. Wear loose pants or shorts. They should be loose enough to fit over bandages, boots, and casts. 9. Don't wear slip-on shoes. Sneakers are preferred. 10. Bring your boot with you to the surgery center/hospital.  Also bring crutches or a walker if your physician has prescribed it for you.  If you do not have this equipment, it will be provided for you after surgery. 11. If you have not been contacted by the surgery center/hospital by the day before your surgery, call to confirm the date and time of your surgery. 12. Leave-time from work may vary depending on the type of surgery you have.  Appropriate arrangements should be made prior to surgery with your employer. 13. Prescriptions will be provided immediately following surgery by your doctor.  Fill these as soon as possible after surgery and take the medication as directed. Pain medications will not be refilled on weekends and must be approved by the doctor. 14. Remove nail polish on the operative foot and avoid getting pedicures prior to surgery. 15. Wash the night before surgery.  The night before surgery wash the foot and leg well with water and the antibacterial soap provided. Be sure to pay special attention to beneath the toenails and in between the toes.  Wash for at least three (3) minutes. Rinse thoroughly with water and dry well with a towel.  Perform this wash unless told not to do so by your physician.  Enclosed: 1 Ice pack (please put in freezer the night before surgery)   1 Hibiclens skin cleaner     Pre-op instructions  If you have any questions regarding the instructions, please do not hesitate to call our office.  [place]: [address], [place], [place] [address] -- [phone]  [place]: [address] [place], [place] [address] -- [phone]  [place]: [address], [place], [place] [address] -- [phone]   Website: [email or web address] 

## 2020-09-10 NOTE — Telephone Encounter (Signed)
Please advise 

## 2020-09-12 ENCOUNTER — Encounter: Payer: Self-pay | Admitting: Family Medicine

## 2020-09-24 ENCOUNTER — Other Ambulatory Visit: Payer: Self-pay | Admitting: Podiatry

## 2020-09-24 NOTE — Telephone Encounter (Signed)
Please advise 

## 2020-09-25 ENCOUNTER — Telehealth: Payer: Self-pay

## 2020-09-25 NOTE — Telephone Encounter (Signed)
Patient called requesting refill of Tramadol.  Per Dr. Logan Bores ok to give a refill.   New script has been sent to pharmacy

## 2020-10-10 ENCOUNTER — Telehealth: Payer: Self-pay

## 2020-10-10 NOTE — Telephone Encounter (Signed)
Patient called and stated that she needs a refill of Gabapentin.  She said the provider that was prescribing is no longer there and she needed a refill, she is almost out of her medication.  Please advise if you can send in her refill.  Thanks

## 2020-10-10 NOTE — Telephone Encounter (Signed)
Dr Evans patient

## 2020-10-29 ENCOUNTER — Other Ambulatory Visit: Payer: Self-pay | Admitting: Podiatry

## 2020-10-29 MED ORDER — GABAPENTIN 300 MG PO CAPS: 300.0000 mg | ORAL_CAPSULE | Freq: Three times a day (TID) | ORAL | 3 refills | Status: AC

## 2020-10-29 NOTE — Telephone Encounter (Signed)
Dr. Amalia Hailey, can we send in a script for Gabapentin for patient?  Please advise

## 2020-10-29 NOTE — Progress Notes (Signed)
PNR

## 2020-10-29 NOTE — Telephone Encounter (Signed)
Rx sent... Gabapentin 300mg  TID #90 with 3 refills. - Dr. Amalia Hailey

## 2020-10-31 NOTE — Telephone Encounter (Signed)
Patient notified that new script for Gabapentin has been sent to her pharmacy

## 2020-11-08 ENCOUNTER — Other Ambulatory Visit: Payer: Self-pay | Admitting: Podiatry

## 2020-11-08 ENCOUNTER — Other Ambulatory Visit: Payer: Self-pay | Admitting: Sports Medicine

## 2020-11-08 DIAGNOSIS — M2041 Other hammer toe(s) (acquired), right foot: Secondary | ICD-10-CM

## 2020-11-08 DIAGNOSIS — M21541 Acquired clubfoot, right foot: Secondary | ICD-10-CM | POA: Diagnosis not present

## 2020-11-08 DIAGNOSIS — M7751 Other enthesopathy of right foot: Secondary | ICD-10-CM | POA: Diagnosis not present

## 2020-11-08 MED ORDER — OXYCODONE HCL 5 MG PO TABS: 5.0000 mg | ORAL_TABLET | ORAL | 0 refills | Status: AC | PRN

## 2020-11-08 MED ORDER — IBUPROFEN 800 MG PO TABS: 800.0000 mg | ORAL_TABLET | Freq: Three times a day (TID) | ORAL | 1 refills | Status: AC

## 2020-11-08 MED ORDER — IBUPROFEN 800 MG PO TABS: 800.0000 mg | ORAL_TABLET | Freq: Three times a day (TID) | ORAL | 1 refills | Status: DC

## 2020-11-08 MED ORDER — OXYCODONE HCL 5 MG PO TABS: 5.0000 mg | ORAL_TABLET | ORAL | 0 refills | Status: DC | PRN

## 2020-11-08 NOTE — Progress Notes (Signed)
PRN postop 

## 2020-11-08 NOTE — Progress Notes (Signed)
Changed Rx for post op pain meds to the correct pharmacy -Dr. Cannon Kettle

## 2020-11-13 ENCOUNTER — Telehealth: Payer: Self-pay

## 2020-11-13 NOTE — Telephone Encounter (Signed)
Patient called requesting another refill on her pain medication.  Please advise

## 2020-11-16 ENCOUNTER — Ambulatory Visit (INDEPENDENT_AMBULATORY_CARE_PROVIDER_SITE_OTHER): Payer: Medicaid Other

## 2020-11-16 ENCOUNTER — Ambulatory Visit (INDEPENDENT_AMBULATORY_CARE_PROVIDER_SITE_OTHER): Payer: Medicaid Other | Admitting: Podiatry

## 2020-11-16 ENCOUNTER — Other Ambulatory Visit: Payer: Self-pay

## 2020-11-16 DIAGNOSIS — M2041 Other hammer toe(s) (acquired), right foot: Secondary | ICD-10-CM

## 2020-11-16 DIAGNOSIS — Z9889 Other specified postprocedural states: Secondary | ICD-10-CM

## 2020-11-16 MED ORDER — HYDROCODONE-ACETAMINOPHEN 10-325 MG PO TABS: 1.0000 | ORAL_TABLET | ORAL | 0 refills | Status: AC | PRN

## 2020-11-16 MED ORDER — DOXYCYCLINE HYCLATE 100 MG PO TABS: 100.0000 mg | ORAL_TABLET | Freq: Two times a day (BID) | ORAL | 0 refills | Status: DC

## 2020-11-16 MED ORDER — DOXYCYCLINE HYCLATE 100 MG PO TABS: 100.0000 mg | ORAL_TABLET | Freq: Two times a day (BID) | ORAL | 0 refills | Status: AC

## 2020-11-16 MED ORDER — HYDROCODONE-ACETAMINOPHEN 10-325 MG PO TABS: 1.0000 | ORAL_TABLET | ORAL | 0 refills | Status: DC | PRN

## 2020-11-16 NOTE — Progress Notes (Signed)
   Subjective:  Patient presents today status post hammertoe repair second and third digit right. DOS: 11/08/2020.  Patient states that she has had some increased pain and sensitivity to the right foot.  She has been weightbearing in the cam boot as instructed.  She states that her daughter changed her dressings over the past week.  She presents for further treatment and evaluation  Past Medical History:  Diagnosis Date  . ADD (attention deficit disorder)   . Anxiety   . Depression   . Fibromyalgia   . GERD (gastroesophageal reflux disease)   . Neuropathy   . Schizophrenia ([place])   . Scoliosis       Objective/Physical Exam Neurovascular status intact.  Skin incisions appear to be well coapted with staples intact.  There is some very localized erythema around the incision sites with some minimal drainage.. No dehiscence. No active bleeding noted. Moderate edema noted to the surgical extremity.  Radiographic Exam:  Orthopedic hardware and osteotomies sites appear to be stable with routine healing.  Assessment: 1. s/p hammertoe repair second third digit right. DOS: 11/08/2020   Plan of Care:  1. Patient was evaluated. X-rays reviewed 2.  Dressings changed today.  Keep clean dry and intact x1 week 3.  Patient may discontinue cam boot.  Postsurgical shoe dispensed 4.  Prescription for doxycycline 100 mg 2 times daily #20 5.  Prescription for hydrocodone 10/3 2 5  mg #30 every 4 hours as needed pain 6.  Return to clinic in 1 week   Edrick Kins, DPM [facility]  Dr. Edrick Kins, DPM    [address], [place] [address]                Office [phone]  Fax [phone]

## 2020-11-16 NOTE — Addendum Note (Signed)
Addended by: Edrick Kins on: 11/16/2020 01:05 PM   Modules accepted: Orders

## 2020-11-23 ENCOUNTER — Ambulatory Visit (INDEPENDENT_AMBULATORY_CARE_PROVIDER_SITE_OTHER): Payer: Medicaid Other | Admitting: Podiatry

## 2020-11-23 ENCOUNTER — Other Ambulatory Visit: Payer: Self-pay

## 2020-11-23 DIAGNOSIS — Z9889 Other specified postprocedural states: Secondary | ICD-10-CM

## 2020-11-23 DIAGNOSIS — M2041 Other hammer toe(s) (acquired), right foot: Secondary | ICD-10-CM

## 2020-11-23 MED ORDER — HYDROCODONE-ACETAMINOPHEN 10-325 MG PO TABS: 1.0000 | ORAL_TABLET | Freq: Four times a day (QID) | ORAL | 0 refills | Status: AC | PRN

## 2020-11-23 NOTE — Progress Notes (Signed)
   Subjective:  Patient presents today status post hammertoe repair second and third digit right. DOS: 11/08/2020.  Patient states that over the last week there has been some improvement.  No new complaints at this time Past Medical History:  Diagnosis Date  . ADD (attention deficit disorder)   . Anxiety   . Depression   . Fibromyalgia   . GERD (gastroesophageal reflux disease)   . Neuropathy   . Schizophrenia ([place])   . Scoliosis       Objective/Physical Exam Neurovascular status intact.  Skin incisions appear to be well coapted with staples intact.  There is some very localized erythema around the incision sites with some minimal drainage however this appears to be significantly improved since last visit. No dehiscence. No active bleeding noted.  Improved edema noted to the surgical extremity.  Assessment: 1. s/p hammertoe repair second third digit right. DOS: 11/08/2020   Plan of Care:  1. Patient was evaluated. 2.  Dressings changed today.  Keep clean dry and intact x1 week 3.  Continue postsurgical shoe 4.  Continue oral doxycycline until completed  5.  Refill prescription for hydrocodone 10/325 mg #30 every 6 hours as needed pain 6.  Return to clinic in 1 week   Edrick Kins, DPM [facility]  Dr. Edrick Kins, DPM    [address], [place] [address]                Office [phone]  Fax [phone]

## 2020-11-30 ENCOUNTER — Ambulatory Visit (INDEPENDENT_AMBULATORY_CARE_PROVIDER_SITE_OTHER): Payer: Medicaid Other

## 2020-11-30 ENCOUNTER — Ambulatory Visit (INDEPENDENT_AMBULATORY_CARE_PROVIDER_SITE_OTHER): Payer: Medicaid Other | Admitting: Podiatry

## 2020-11-30 ENCOUNTER — Other Ambulatory Visit: Payer: Self-pay

## 2020-11-30 ENCOUNTER — Encounter: Payer: Self-pay | Admitting: Podiatry

## 2020-11-30 DIAGNOSIS — M2041 Other hammer toe(s) (acquired), right foot: Secondary | ICD-10-CM

## 2020-11-30 DIAGNOSIS — Z9889 Other specified postprocedural states: Secondary | ICD-10-CM

## 2020-11-30 MED ORDER — HYDROCODONE-ACETAMINOPHEN 10-325 MG PO TABS: 1.0000 | ORAL_TABLET | ORAL | 0 refills | Status: AC | PRN

## 2020-12-02 NOTE — Progress Notes (Signed)
   Subjective:  Patient presents today status post hammertoe repair second and third digit right. DOS: 11/08/2020.  Patient states that over the last week there has been some improvement.  No new complaints at this time Past Medical History:  Diagnosis Date  . ADD (attention deficit disorder)   . Anxiety   . Depression   . Fibromyalgia   . GERD (gastroesophageal reflux disease)   . Neuropathy   . Schizophrenia ([place])   . Scoliosis       Objective/Physical Exam Neurovascular status intact.  Skin incisions appear to be well coapted with staples intact.  There is some very localized erythema around the incision sites with some minimal drainage however this appears to be significantly improved since last visit. No dehiscence. No active bleeding noted.  Improved edema noted to the surgical extremity.  Assessment: 1. s/p hammertoe repair second third digit right. DOS: 11/08/2020   Plan of Care:  1. Patient was evaluated. 2.  Staples removed today 3.  Continue postsurgical shoe 4.  Refill prescription for Vicodin 10/3 2 5  mg #28 5.  Return to clinic in 1 week for percutaneous pin removal   Edrick Kins, DPM [facility]  Dr. Edrick Kins, DPM    [address], [place] [address]                Office [phone]  Fax [phone]

## 2020-12-07 ENCOUNTER — Ambulatory Visit (INDEPENDENT_AMBULATORY_CARE_PROVIDER_SITE_OTHER): Payer: Medicaid Other

## 2020-12-07 ENCOUNTER — Ambulatory Visit (INDEPENDENT_AMBULATORY_CARE_PROVIDER_SITE_OTHER): Payer: Medicaid Other | Admitting: Podiatry

## 2020-12-07 ENCOUNTER — Encounter: Payer: Self-pay | Admitting: Podiatry

## 2020-12-07 ENCOUNTER — Other Ambulatory Visit: Payer: Self-pay

## 2020-12-07 ENCOUNTER — Encounter: Payer: Medicaid Other | Admitting: Podiatry

## 2020-12-07 DIAGNOSIS — Z9889 Other specified postprocedural states: Secondary | ICD-10-CM

## 2020-12-07 DIAGNOSIS — M2041 Other hammer toe(s) (acquired), right foot: Secondary | ICD-10-CM | POA: Diagnosis not present

## 2020-12-07 MED ORDER — HYDROCODONE-ACETAMINOPHEN 10-325 MG PO TABS: 1.0000 | ORAL_TABLET | ORAL | 0 refills | Status: AC | PRN

## 2020-12-07 NOTE — Progress Notes (Signed)
   Subjective:  Patient presents today status post hammertoe repair second and third digit right. DOS: 11/08/2020.  Patient states that over the last week there has been some improvement.  No new complaints at this time  Past Medical History:  Diagnosis Date  . ADD (attention deficit disorder)   . Anxiety   . Depression   . Fibromyalgia   . GERD (gastroesophageal reflux disease)   . Neuropathy   . Schizophrenia ([place])   . Scoliosis       Objective/Physical Exam Neurovascular status intact.  Skin incisions appear to be well coapted and healed.  Minimal edema noted to the surgical area.  No erythema.  No clinical evidence of infection.    Radiographic exam Osteotomies and orthopedic hardware appears stable and intact.  Removal of percutaneous fixation pins noted.  Good alignment of the second and third rays.  No new findings compared to prior exam  Assessment: 1. s/p hammertoe repair second third digit right. DOS: 11/08/2020   Plan of Care:  1. Patient was evaluated.  X-rays reviewed 2. percutaneous fixation pins removed today 3.  Continue postsurgical shoe 4.  Refill prescription for Vicodin 10/3 2 5  mg #28 5.  Return to clinic 4 weeks   Edrick Kins, DPM [facility]  Dr. Edrick Kins, DPM    [address], [place] [address]                Office [phone]  Fax [phone]

## 2020-12-17 ENCOUNTER — Telehealth: Payer: Self-pay

## 2020-12-17 NOTE — Telephone Encounter (Signed)
Patient called and stated that she returned to work yesterday and now her toes is really swollen, top of foot is red and painful.  She was wanting to know if you can call in some percocet for her.  If so, please send in #[phone] so precert will not be required.  Please advise on this  Thanks!

## 2020-12-18 ENCOUNTER — Other Ambulatory Visit: Payer: Self-pay

## 2020-12-18 ENCOUNTER — Telehealth: Payer: Self-pay | Admitting: Podiatry

## 2020-12-18 ENCOUNTER — Other Ambulatory Visit: Payer: Self-pay | Admitting: Podiatry

## 2020-12-18 MED ORDER — HYDROCODONE-ACETAMINOPHEN 10-325 MG PO TABS: 1.0000 | ORAL_TABLET | ORAL | 0 refills | Status: AC | PRN

## 2020-12-18 NOTE — Telephone Encounter (Signed)
The following patient has requested refill on pain meds, Please Advise

## 2020-12-18 NOTE — Telephone Encounter (Signed)
Rx vicodin10/325 #28 sent

## 2020-12-18 NOTE — Telephone Encounter (Signed)
Rx sent 

## 2020-12-18 NOTE — Progress Notes (Signed)
PRN postop 

## 2021-01-15 ENCOUNTER — Other Ambulatory Visit: Payer: Self-pay | Admitting: Family Medicine

## 2021-01-15 ENCOUNTER — Encounter: Payer: Medicaid Other | Admitting: Podiatry

## 2021-01-15 DIAGNOSIS — Z1231 Encounter for screening mammogram for malignant neoplasm of breast: Secondary | ICD-10-CM
